# Patient Record
Sex: Female | Born: 1970 | Race: Black or African American | Hispanic: No | Marital: Single | Smoking: Never smoker
Health system: Southern US, Community
[De-identification: ages and names within clinical notes are randomized; demographics above are authoritative.]

## PROBLEM LIST (undated history)

## (undated) DIAGNOSIS — E079 Disorder of thyroid, unspecified: Secondary | ICD-10-CM

## (undated) DIAGNOSIS — E785 Hyperlipidemia, unspecified: Secondary | ICD-10-CM

## (undated) HISTORY — DX: Hyperlipidemia, unspecified: E78.5

---

## 1997-05-16 ENCOUNTER — Other Ambulatory Visit: Admission: RE | Admit: 1997-05-16 | Discharge: 1997-05-16 | Payer: Self-pay | Admitting: Internal Medicine

## 1998-11-22 ENCOUNTER — Ambulatory Visit (HOSPITAL_COMMUNITY): Admission: RE | Admit: 1998-11-22 | Discharge: 1998-11-22 | Payer: Self-pay | Admitting: Family Medicine

## 1998-11-22 ENCOUNTER — Encounter: Payer: Self-pay | Admitting: Family Medicine

## 1998-12-29 ENCOUNTER — Ambulatory Visit (HOSPITAL_COMMUNITY): Admission: RE | Admit: 1998-12-29 | Discharge: 1998-12-29 | Payer: Self-pay | Admitting: Family Medicine

## 1999-02-09 ENCOUNTER — Ambulatory Visit (HOSPITAL_COMMUNITY): Admission: RE | Admit: 1999-02-09 | Discharge: 1999-02-09 | Payer: Self-pay | Admitting: *Deleted

## 1999-03-26 ENCOUNTER — Ambulatory Visit (HOSPITAL_COMMUNITY): Admission: RE | Admit: 1999-03-26 | Discharge: 1999-03-26 | Payer: Self-pay | Admitting: *Deleted

## 1999-06-09 ENCOUNTER — Encounter: Admission: RE | Admit: 1999-06-09 | Discharge: 1999-09-07 | Payer: Self-pay | Admitting: Obstetrics

## 1999-06-10 ENCOUNTER — Encounter: Admission: RE | Admit: 1999-06-10 | Discharge: 1999-06-10 | Payer: Self-pay | Admitting: Obstetrics & Gynecology

## 1999-06-17 ENCOUNTER — Encounter: Admission: RE | Admit: 1999-06-17 | Discharge: 1999-06-17 | Payer: Self-pay | Admitting: Obstetrics & Gynecology

## 1999-06-24 ENCOUNTER — Encounter: Admission: RE | Admit: 1999-06-24 | Discharge: 1999-06-24 | Payer: Self-pay | Admitting: Obstetrics & Gynecology

## 1999-06-30 ENCOUNTER — Ambulatory Visit (HOSPITAL_COMMUNITY): Admission: RE | Admit: 1999-06-30 | Discharge: 1999-06-30 | Payer: Self-pay | Admitting: *Deleted

## 1999-07-01 ENCOUNTER — Encounter: Admission: RE | Admit: 1999-07-01 | Discharge: 1999-07-01 | Payer: Self-pay | Admitting: Obstetrics

## 1999-07-15 ENCOUNTER — Encounter: Admission: RE | Admit: 1999-07-15 | Discharge: 1999-07-15 | Payer: Self-pay | Admitting: Obstetrics

## 1999-07-22 ENCOUNTER — Encounter: Admission: RE | Admit: 1999-07-22 | Discharge: 1999-07-22 | Payer: Self-pay | Admitting: Obstetrics

## 1999-08-12 ENCOUNTER — Encounter: Admission: RE | Admit: 1999-08-12 | Discharge: 1999-08-12 | Payer: Self-pay | Admitting: Obstetrics

## 1999-08-21 ENCOUNTER — Inpatient Hospital Stay (HOSPITAL_COMMUNITY): Admission: AD | Admit: 1999-08-21 | Discharge: 1999-08-24 | Payer: Self-pay | Admitting: *Deleted

## 1999-08-21 ENCOUNTER — Encounter (INDEPENDENT_AMBULATORY_CARE_PROVIDER_SITE_OTHER): Payer: Self-pay

## 2001-12-26 ENCOUNTER — Encounter: Payer: Self-pay | Admitting: Family Medicine

## 2001-12-26 ENCOUNTER — Ambulatory Visit (HOSPITAL_COMMUNITY): Admission: RE | Admit: 2001-12-26 | Discharge: 2001-12-26 | Payer: Self-pay | Admitting: Family Medicine

## 2002-05-18 ENCOUNTER — Ambulatory Visit (HOSPITAL_COMMUNITY): Admission: RE | Admit: 2002-05-18 | Discharge: 2002-05-18 | Payer: Self-pay | Admitting: Family Medicine

## 2002-05-18 ENCOUNTER — Encounter: Payer: Self-pay | Admitting: Family Medicine

## 2002-08-07 ENCOUNTER — Emergency Department (HOSPITAL_COMMUNITY): Admission: EM | Admit: 2002-08-07 | Discharge: 2002-08-07 | Payer: Self-pay | Admitting: Emergency Medicine

## 2003-12-03 ENCOUNTER — Ambulatory Visit: Payer: Self-pay | Admitting: Family Medicine

## 2004-03-04 ENCOUNTER — Ambulatory Visit: Payer: Self-pay | Admitting: Family Medicine

## 2004-04-14 ENCOUNTER — Other Ambulatory Visit: Admission: RE | Admit: 2004-04-14 | Discharge: 2004-04-14 | Payer: Self-pay | Admitting: Family Medicine

## 2004-04-14 ENCOUNTER — Ambulatory Visit: Payer: Self-pay | Admitting: Family Medicine

## 2004-05-26 ENCOUNTER — Ambulatory Visit: Payer: Self-pay | Admitting: Family Medicine

## 2004-08-17 ENCOUNTER — Ambulatory Visit: Payer: Self-pay | Admitting: Family Medicine

## 2004-11-09 ENCOUNTER — Ambulatory Visit: Payer: Self-pay | Admitting: Family Medicine

## 2004-12-15 ENCOUNTER — Ambulatory Visit: Payer: Self-pay | Admitting: Family Medicine

## 2005-02-01 ENCOUNTER — Ambulatory Visit: Payer: Self-pay | Admitting: Family Medicine

## 2005-04-26 ENCOUNTER — Ambulatory Visit: Payer: Self-pay | Admitting: Family Medicine

## 2005-08-03 ENCOUNTER — Ambulatory Visit: Payer: Self-pay | Admitting: Internal Medicine

## 2005-08-04 ENCOUNTER — Ambulatory Visit: Payer: Self-pay | Admitting: Family Medicine

## 2005-10-31 ENCOUNTER — Encounter (INDEPENDENT_AMBULATORY_CARE_PROVIDER_SITE_OTHER): Payer: Self-pay | Admitting: Family Medicine

## 2005-10-31 LAB — CONVERTED CEMR LAB: Pap Smear: NORMAL

## 2005-11-11 ENCOUNTER — Encounter (INDEPENDENT_AMBULATORY_CARE_PROVIDER_SITE_OTHER): Payer: Self-pay | Admitting: Family Medicine

## 2005-11-11 ENCOUNTER — Ambulatory Visit: Payer: Self-pay | Admitting: Family Medicine

## 2005-11-11 ENCOUNTER — Other Ambulatory Visit: Admission: RE | Admit: 2005-11-11 | Discharge: 2005-11-11 | Payer: Self-pay | Admitting: Family Medicine

## 2006-03-14 ENCOUNTER — Ambulatory Visit: Payer: Self-pay | Admitting: Family Medicine

## 2006-07-06 ENCOUNTER — Encounter (INDEPENDENT_AMBULATORY_CARE_PROVIDER_SITE_OTHER): Payer: Self-pay | Admitting: Family Medicine

## 2006-07-06 DIAGNOSIS — Z8701 Personal history of pneumonia (recurrent): Secondary | ICD-10-CM | POA: Insufficient documentation

## 2006-07-06 DIAGNOSIS — A64 Unspecified sexually transmitted disease: Secondary | ICD-10-CM | POA: Insufficient documentation

## 2006-07-06 DIAGNOSIS — E039 Hypothyroidism, unspecified: Secondary | ICD-10-CM | POA: Insufficient documentation

## 2006-07-06 DIAGNOSIS — D509 Iron deficiency anemia, unspecified: Secondary | ICD-10-CM

## 2006-07-06 DIAGNOSIS — R809 Proteinuria, unspecified: Secondary | ICD-10-CM

## 2007-04-08 ENCOUNTER — Emergency Department (HOSPITAL_COMMUNITY): Admission: EM | Admit: 2007-04-08 | Discharge: 2007-04-08 | Payer: Self-pay | Admitting: Emergency Medicine

## 2007-07-07 ENCOUNTER — Ambulatory Visit: Payer: Self-pay | Admitting: Family Medicine

## 2007-08-21 ENCOUNTER — Ambulatory Visit: Payer: Self-pay | Admitting: Internal Medicine

## 2007-08-21 ENCOUNTER — Encounter (INDEPENDENT_AMBULATORY_CARE_PROVIDER_SITE_OTHER): Payer: Self-pay | Admitting: Family Medicine

## 2007-08-21 LAB — CONVERTED CEMR LAB
CO2: 21 meq/L (ref 19–32)
Cholesterol: 172 mg/dL (ref 0–200)
Free T4: 1.37 ng/dL (ref 0.89–1.80)
Glucose, Bld: 251 mg/dL — ABNORMAL HIGH (ref 70–99)
HDL: 48 mg/dL (ref >39)
Lymphocytes Relative: 32 % (ref 12–46)
Lymphs Abs: 1.6 10*3/uL (ref 0.7–4.0)
Neutrophils Relative %: 54 % (ref 43–77)
Platelets: 252 10*3/uL (ref 150–400)
Potassium: 4.2 meq/L (ref 3.5–5.3)
Sodium: 137 meq/L (ref 135–145)
TSH: 0.62 microintl units/mL (ref 0.350–4.50)
Total CHOL/HDL Ratio: 3.6
VLDL: 25 mg/dL (ref 0–40)
WBC: 5.1 10*3/uL (ref 4.0–10.5)

## 2007-09-07 ENCOUNTER — Ambulatory Visit: Payer: Self-pay | Admitting: Internal Medicine

## 2007-09-13 ENCOUNTER — Ambulatory Visit: Payer: Self-pay | Admitting: Family Medicine

## 2007-09-15 ENCOUNTER — Ambulatory Visit: Payer: Self-pay | Admitting: Internal Medicine

## 2007-09-25 ENCOUNTER — Ambulatory Visit: Payer: Self-pay | Admitting: Family Medicine

## 2007-11-03 ENCOUNTER — Ambulatory Visit: Payer: Self-pay | Admitting: Family Medicine

## 2008-01-09 ENCOUNTER — Encounter (INDEPENDENT_AMBULATORY_CARE_PROVIDER_SITE_OTHER): Payer: Self-pay | Admitting: Family Medicine

## 2008-01-09 ENCOUNTER — Ambulatory Visit: Payer: Self-pay | Admitting: Family Medicine

## 2008-01-09 ENCOUNTER — Other Ambulatory Visit: Admission: RE | Admit: 2008-01-09 | Discharge: 2008-01-09 | Payer: Self-pay | Admitting: Family Medicine

## 2008-04-30 ENCOUNTER — Ambulatory Visit: Payer: Self-pay | Admitting: Family Medicine

## 2008-04-30 LAB — CONVERTED CEMR LAB
AST: 14 units/L (ref 0–37)
BUN: 11 mg/dL (ref 6–23)
Calcium: 9 mg/dL (ref 8.4–10.5)
Chloride: 104 meq/L (ref 96–112)
Creatinine, Ser: 0.85 mg/dL (ref 0.40–1.20)
Free T4: 0.59 ng/dL — ABNORMAL LOW (ref 0.89–1.80)
Glucose, Bld: 133 mg/dL — ABNORMAL HIGH (ref 70–99)
TSH: 14.297 microintl units/mL — ABNORMAL HIGH (ref 0.350–4.500)

## 2008-07-16 ENCOUNTER — Ambulatory Visit: Payer: Self-pay | Admitting: Family Medicine

## 2008-07-16 LAB — CONVERTED CEMR LAB
Free T4: 0.55 ng/dL — ABNORMAL LOW (ref 0.80–1.80)
TSH: 13.096 microintl units/mL — ABNORMAL HIGH (ref 0.350–4.500)

## 2008-09-18 ENCOUNTER — Ambulatory Visit: Payer: Self-pay | Admitting: Family Medicine

## 2009-01-28 ENCOUNTER — Ambulatory Visit: Payer: Self-pay | Admitting: Family Medicine

## 2009-01-28 LAB — CONVERTED CEMR LAB: T4, Total: 4.7 ug/dL — ABNORMAL LOW (ref 5.0–12.5)

## 2009-07-04 ENCOUNTER — Ambulatory Visit: Payer: Self-pay | Admitting: Family Medicine

## 2009-11-04 ENCOUNTER — Encounter (INDEPENDENT_AMBULATORY_CARE_PROVIDER_SITE_OTHER): Payer: Self-pay | Admitting: Family Medicine

## 2009-11-04 LAB — CONVERTED CEMR LAB
Albumin: 4.7 g/dL (ref 3.5–5.2)
BUN: 12 mg/dL (ref 6–23)
CO2: 23 meq/L (ref 19–32)
Calcium: 9.5 mg/dL (ref 8.4–10.5)
Chloride: 104 meq/L (ref 96–112)
Cholesterol: 204 mg/dL — ABNORMAL HIGH (ref 0–200)
Creatinine, Ser: 0.9 mg/dL (ref 0.40–1.20)
Glucose, Bld: 167 mg/dL — ABNORMAL HIGH (ref 70–99)
HDL: 61 mg/dL (ref >39)
Potassium: 4.2 meq/L (ref 3.5–5.3)
Total CHOL/HDL Ratio: 3.3
Triglycerides: 87 mg/dL (ref <150)

## 2009-11-25 ENCOUNTER — Encounter (INDEPENDENT_AMBULATORY_CARE_PROVIDER_SITE_OTHER): Payer: Self-pay | Admitting: Family Medicine

## 2009-11-25 LAB — CONVERTED CEMR LAB
Free T4: 1.09 ng/dL (ref 0.80–1.80)
Hgb A1c MFr Bld: 8.8 % — ABNORMAL HIGH (ref <5.7)

## 2010-01-01 ENCOUNTER — Ambulatory Visit: Payer: Self-pay | Admitting: Obstetrics & Gynecology

## 2010-02-04 ENCOUNTER — Encounter: Payer: Self-pay | Admitting: Obstetrics & Gynecology

## 2010-02-04 ENCOUNTER — Ambulatory Visit
Admission: RE | Admit: 2010-02-04 | Discharge: 2010-02-04 | Payer: Self-pay | Source: Home / Self Care | Attending: Family Medicine | Admitting: Family Medicine

## 2010-02-05 ENCOUNTER — Encounter: Payer: Self-pay | Admitting: Obstetrics & Gynecology

## 2010-02-05 LAB — CONVERTED CEMR LAB: Yeast Wet Prep HPF POC: NONE SEEN

## 2010-02-18 ENCOUNTER — Ambulatory Visit
Admission: RE | Admit: 2010-02-18 | Discharge: 2010-02-18 | Payer: Self-pay | Source: Home / Self Care | Attending: Obstetrics and Gynecology | Admitting: Obstetrics and Gynecology

## 2010-02-18 LAB — POCT PREGNANCY, URINE: Preg Test, Ur: NEGATIVE

## 2010-02-19 NOTE — Progress Notes (Signed)
Ariana Cunningham, Ariana Cunningham              ACCOUNT NO.:  [id number]  MEDICAL RECORD NO.:  [id number]          PATIENT TYPE:  WOC  LOCATION:  [facility]                   FACILITY:  [facility]  PHYSICIAN:  Ariana Mellow, DO   DATE OF BIRTH:  1970-06-08  DATE OF SERVICE:                                 CLINIC NOTE  REASON FOR EXAMINATION:  IUD insertion.  HISTORY OF PRESENT ILLNESS:  The patient is a 40 year old gravida 2, para 2-0-0-2 African American female who presented to the [facility] on February 04, 2010 to establish gynecological care and to request contraception.  At that time she was seen by Dr. Macon Large and had a Pap test, gonorrhea and Chlamydia screening and was counseled about her birth control options and desired for an IUD.  Therefore, she presents to the clinic today for her IUD insertion.  She has no complaints today.  Past OB/GYN history has not changed.  The patient has a history of 1 vaginal delivery and 1 C-section, menarche at age 89, regular 30 days cycles with 3-4 days of flow.  Denies any history of abnormal Pap or any sexually transmitted disease.  PAST MEDICAL HISTORY:  Diabetes and hypothyroidism for which she is followed in [facility].  SURGICAL HISTORY:  One cesarean section.  MEDICATIONS:  Metformin and Synthroid.  ALLERGIES:  No known drug allergies.  PHYSICAL EXAMINATION:  VITAL SIGNS:  The patient has a temperature of 98.9, pulse of 69, blood pressure of 136/89. GENERAL:  She is in no apparent distress. GENITOURINARY:  Externally her external vagina is normal appearance. Her internal vagina is pink with appropriate amount of rugae, slight amount of clear discharge.  The patient was consented for IUD placement and time-out was performed at the time of procedure.  Uterus was sounded to 6.5 cm.  A Mirena IUD was placed without difficulty.  The strings were trimmed and the patient was allowed to feel the strings for other appropriate  coarseness.  ASSESSMENT:  Desired contraception with intrauterine device.  PLAN:  IUD was placed today without difficulty.  The patient was counseled on the risk and monitoring of the IUD and is asked to come back in about 3 months to have a string check or sooner if there is a problem.  Appropriate lab tests for today's exam include a negative pregnancy test today.  It was also reviewed the patient that she had a normal Pap test and negative gonorrhea and Chlamydia screening.  The patient has no questions and is discharged from the clinic in stable condition.          ______________________________ Ariana Mellow, DO    SH/MEDQ  D:  02/18/2010  T:  02/19/2010  Job:  [id number]

## 2010-05-07 ENCOUNTER — Ambulatory Visit: Payer: Self-pay | Admitting: Family

## 2010-05-20 ENCOUNTER — Ambulatory Visit (INDEPENDENT_AMBULATORY_CARE_PROVIDER_SITE_OTHER): Payer: Medicaid Other | Admitting: Obstetrics and Gynecology

## 2010-05-20 DIAGNOSIS — Z30431 Encounter for routine checking of intrauterine contraceptive device: Secondary | ICD-10-CM

## 2010-06-12 NOTE — Op Note (Signed)
[facility]  Patient:    Ariana Cunningham, Ariana Cunningham                       MRN: [id number] Proc. Date: 08/21/99 Adm. Date:  [id number] Attending:  Michaelle Copas                           Operative Report  PREOPERATIVE DIAGNOSES:       1. At [redacted] weeks gestation.                               2. Gestational diabetes.                               3. Persistent occiput posterior                                  position.                               4. Arrest of descent.  POSTOPERATIVE DIAGNOSES:      1. At [redacted] weeks gestation.                               2. Gestational diabetes.                               3. Persistent occiput posterior                                  position.                               4. Arrest of descent.  OPERATION:                    Primary low transverse cesarean section.  SURGEON:                      Charles A. Clearance Coots, M.D.  ASSISTANT:                    Lelon Perla, certified surgical technician.  ANESTHESIA:                   Epidural anesthesia.  ESTIMATED BLOOD LOSS:         800 mL.  IV FLUIDS:                    2400 mL.  URINE OUTPUT:                 250 mL of clear urine.  COMPLICATIONS:                None.  DRAINS:                       Foley catheter to gravity.  FINDINGS:  A viable female at 1615, Apgars of 8 at one minute and 9 at five minutes.  Weight of 3605 g.  Normal uterus, ovaries and fallopian tubes.  DESCRIPTION OF PROCEDURE:     The patient was brought to the operating room and after satisfactory redosing of the epidural, the abdomen was prepped and draped in the usual sterile fashion.  A Pfannenstiel skin incision was made with the scalpel that was deepened down to the fascia with the scalpel. The fascia was nicked in the midline and the fascial incision was extended to the left and the right with curved Mayo scissors.  The superior and inferior fascial edges were  taken off of the rectus muscles with sharp dissection.  The rectus muscle was bluntly sharply divided in the midline superiorly and inferiorly being careful to avoid the urinary bladder. The peritoneum was entered digitally and was extended to the left and to the right.  The bladder blade was positioned.  The vesicouterine fold and peritoneum above the reflection of the urinary bladder was grasped with forceps and was incised and undermined with Metzenbaum scissors.  The incision was extended to the left and to the right with the Metzenbaum scissors.  The bladder flap was bluntly developed and the bladder blade was repositioned in front of the urinary bladder, placing it well out of the operative field.  The uterus was entered transversely in the lower uterine segment with the scalpel.  The uterine incision was extended to the left and to the right with bandage scissors in a smile configuration.  The amniotic sac was then ruptured and a moderate amount of clear fluid was expelled.  The vertex was noted to be in the left occiput posterior position and was delivered with the aid of fundal pressure from the assistant.  The infants mouth and nose were suctioned with a suction bulb and the delivery was completed with the aid of fundal pressure from the assistant. The umbilical cord was doubly clamped with Kelly forceps and was cut with bandage scissors and the infant was handed off to the nursing staff.  Cord blood was obtained and the placenta was spontaneously expelled from the uterine cavity intact.  The endometrial surface was thoroughly debrided with a dry lap sponge and the uterus was exteriorized and the edges of the uterine incision were grasped with ring forceps. The uterus was then closed with a continuous interlocking suture of 0 Monocryl from each corner to the center.  Hemostasis was excellent.  The uterus was then placed back in its normal anatomic position and the pelvic cavity was  thoroughly irrigated with warm saline solution.  All clots were removed.  The abdomen was then closed as follows.  The fascia was closed with continuous suture of 0 PDS from each corner to the center.  Subcutaneous tissue was thoroughly irrigated with warm saline solution and all areas of subcutaneous bleeding were coagulated with the Bovie. Skin was approximated with surgical stainless steel staples.  A sterile bandage was applied to the incision closure.  Surgical technician indicated that all needles, sponge and instrument counts were correct.  The patient tolerated the procedure well and was transported to the recovery room in satisfactory condition. DD:  08/21/99 TD:  08/24/99 Job: [id number]

## 2010-06-12 NOTE — Discharge Summary (Signed)
[facility]  Patient:    Ariana Cunningham, Ariana Cunningham                       MRN: [id number] Adm. Date:  09/21/99 Attending:  Michaelle Copas Dictator:   Jamey Reas, M.D.                           Discharge Summary  DATE OF BIRTH:                1970/02/11  ADMISSION DIAGNOSES:          1. Term pregnancy.                               2. Onset of labor.                               3. Gestational diabetes.  DISCHARGE DIAGNOSES:          1. Term pregnancy.                               2. Status post low transverse cesarean section.                               3. Gestational diabetes. CONSULTS:                     None.  PROCEDURES:                   Patient had low transverse cesarean section on August 21, 1999 performed by Dr. Coral Ceo.  HOSPITAL COURSE:              Patient presented with spontaneous onset of labor.  Had failure to dilate and persistent OP position.  She had a primary low transverse cesarean section at which routine postoperative care patient was noted to be slightly anemic at time of discharge 7.0.  Baby Apgars were 8 at one minute and 9 at five minutes.  She is bottle feeding.  She received Depo-Provera 150 mg IM injection prior to discharge.  DISCHARGE CONDITION:          Stable.  MEDICATIONS:                  1. Motrin 600 mg one p.o. t.i.d. p.r.n. pain.                               2. Iron sulfate 325 mg one p.o. t.i.d.                               3. Prenatal vitamins one p.o. q.d.                               4. Percocet one to two p.o. q.4-6h. p.r.n. pain.  ACTIVITIES:                   Per instruction booklet.  DIET:  She will resume a regular diet at home.  FOLLOW-UP:                    She will follow up in six weeks at [facility] for routine check.  Her female infant will be discharged with her. DD:  10/27/99 TD:  10/27/99 Job: [id number]

## 2011-04-14 ENCOUNTER — Other Ambulatory Visit (HOSPITAL_COMMUNITY)
Admission: RE | Admit: 2011-04-14 | Discharge: 2011-04-14 | Disposition: A | Discharge status: Home / Self Care | Payer: Medicaid Other | Source: Ambulatory Visit | Attending: Advanced Practice Midwife | Admitting: Advanced Practice Midwife

## 2011-04-14 ENCOUNTER — Ambulatory Visit (INDEPENDENT_AMBULATORY_CARE_PROVIDER_SITE_OTHER): Payer: Medicaid Other | Admitting: Advanced Practice Midwife

## 2011-04-14 ENCOUNTER — Encounter: Payer: Self-pay | Admitting: Advanced Practice Midwife

## 2011-04-14 VITALS — BP 119/76 | HR 72 | Temp 97.8°F | Ht 64.0 in | Wt 170.3 lb

## 2011-04-14 DIAGNOSIS — Z01419 Encounter for gynecological examination (general) (routine) without abnormal findings: Secondary | ICD-10-CM | POA: Insufficient documentation

## 2011-04-14 DIAGNOSIS — Z0000 Encounter for general adult medical examination without abnormal findings: Secondary | ICD-10-CM

## 2011-04-14 NOTE — Progress Notes (Signed)
  Subjective:     Ariana Cunningham is a 41 y.o. female here for a routine exam.  Current complaints: none.  Personal health questionnaire reviewed: no.   Gynecologic History No LMP recorded. Patient is not currently having periods (Reason: IUD). Contraception: IUD Last Pap: one year . Results were: normal Last mammogram: n/a    Obstetric History OB History    Grav Para Term Preterm Abortions TAB SAB Ect Mult Living   2 2 2       2      # Outc Date GA Lbr Len/2nd Wgt Sex Del Anes PTL Lv   1 TRM            2 TRM                The following portions of the patient's history were reviewed and updated as appropriate: allergies, current medications, past family history, past medical history, past social history, past surgical history and problem list.  Review of Systems Pertinent items are noted in HPI.    Objective:    BP 119/76  Pulse 72  Temp(Src) 97.8 F (36.6 C) (Oral)  Ht 5\' 4"  (1.626 m)  Wt 170 lb 4.8 oz (77.248 kg)  BMI 29.23 kg/m2  General Appearance:    Alert, cooperative, no distress, appears stated age  Head:    Normocephalic, without obvious abnormality, atraumatic  Eyes:      Ears:      Nose:   Throat:     Neck:   Supple, symmetrical, trachea midline, no adenopathy;    thyroid:  no enlargement/tenderness/nodules;   Back:     Symmetric, no curvature, ROM normal, no CVA tenderness  Lungs:     Clear to auscultation bilaterally, respirations unlabored  Chest Wall:    No tenderness or deformity   Heart:    Regular rate and rhythm, S1 and S2 normal, no murmur, rub   or gallop  Breast Exam:    No tenderness, masses, or nipple abnormality  Abdomen:     Soft, non-tender, bowel sounds active all four quadrants,    no masses, no organomegaly  Genitalia:    Normal female without lesion, discharge or tenderness IUD string intact  Rectal:      Extremities:   Extremities normal, atraumatic, no cyanosis or edema  Pulses:   2+ and symmetric all extremities  Skin:    Skin color, texture, turgor normal, no rashes or lesions  Lymph nodes:     Neurologic:   Grossly normal      Assessment:    Healthy female exam.    Plan:    Follow up in: 1 year.

## 2011-04-14 NOTE — Patient Instructions (Signed)

## 2011-04-19 ENCOUNTER — Encounter: Payer: Self-pay | Admitting: *Deleted

## 2011-04-19 ENCOUNTER — Telehealth: Payer: Self-pay | Admitting: Obstetrics and Gynecology

## 2011-04-19 NOTE — Telephone Encounter (Signed)
Patient called and left message to call us back in regard of this Rx Called in in [facility] on [address] --Flagyl 2gm POX1 for Trich.

## 2011-04-19 NOTE — Telephone Encounter (Signed)
Message copied by Toula Moos on Mon Apr 19, 2011  9:02 AM ------      Message from: Aviva Signs      Created: Sat Apr 17, 2011 12:10 AM      Regarding: Trich on pap, also needs pap letter       RNs.    Trich seen on pap, needs Rx for Flagyl 2 gm po x1 (I will put order in but can you call?)            Admin.  Normal pap letter please

## 2011-04-19 NOTE — Telephone Encounter (Signed)
Telephoned pt at home # and advised pt of results and that med was at pharmacy and that partner would also need to be treated. Pt voiced understanding.

## 2012-03-21 ENCOUNTER — Emergency Department (HOSPITAL_COMMUNITY)
Admission: EM | Admit: 2012-03-21 | Discharge: 2012-03-21 | Disposition: A | Discharge status: Home / Self Care | Payer: Self-pay | Source: Home / Self Care

## 2012-03-21 ENCOUNTER — Encounter (HOSPITAL_COMMUNITY): Payer: Self-pay | Admitting: *Deleted

## 2012-03-21 DIAGNOSIS — E119 Type 2 diabetes mellitus without complications: Secondary | ICD-10-CM

## 2012-03-21 DIAGNOSIS — E039 Hypothyroidism, unspecified: Secondary | ICD-10-CM

## 2012-03-21 DIAGNOSIS — I10 Essential (primary) hypertension: Secondary | ICD-10-CM

## 2012-03-21 HISTORY — DX: Disorder of thyroid, unspecified: E07.9

## 2012-03-21 MED ORDER — LISINOPRIL 5 MG PO TABS: 5.0000 mg | ORAL_TABLET | Freq: Every day | ORAL | Status: DC

## 2012-03-21 MED ORDER — FREESTYLE SYSTEM KIT: 1.0000 | PACK | Status: DC | PRN

## 2012-03-21 MED ORDER — METFORMIN HCL 500 MG PO TABS: 500.0000 mg | ORAL_TABLET | Freq: Two times a day (BID) | ORAL | Status: DC

## 2012-03-21 MED ORDER — GLUCOSE BLOOD VI STRP: ORAL_STRIP | Status: DC

## 2012-03-21 MED ORDER — LEVOTHYROXINE SODIUM 175 MCG PO TABS: 175.0000 ug | ORAL_TABLET | Freq: Every day | ORAL | Status: DC

## 2012-03-21 NOTE — ED Provider Notes (Signed)
History     CSN: [id number]  Arrival date & time 03/21/12  1117   First MD Initiated Contact with Patient 03/21/12 1152      Chief Complaint  Patient presents with  . Follow-up    (Consider location/radiation/quality/duration/timing/severity/associated sxs/prior treatment) HPI  42 year old female with history of uncontrolled diabetes mellitus, hypothyroidism who [facility] comes for medication refill. Patient has been on metformin and Synthroid but has not been compliant with her medication. On reviewing labs that was done in June 2013 her hemoglobin A1c was 10 .6. Patient denies any headache, blurry vision, dry mouth, dizziness, chest pain, palpitations, shortness of breath, abdominal pain, nausea, vomiting, tingling or numbness of extremities. Patient denies any polyuria, polydipsia, bowel symptoms. Denies any change in her weight or appetite, hair loss, menstrual irregularities.  Past Medical History  Diagnosis Date  . Hyperlipidemia   . Diabetes mellitus   . Thyroid condition     Past Surgical History  Procedure Laterality Date  . Cesarean section      Family History  Problem Relation Age of Onset  . Family history unknown: Yes    History  Substance Use Topics  . Smoking status: Never Smoker   . Smokeless tobacco: Never Used  . Alcohol Use: No    OB History   Grav Para Term Preterm Abortions TAB SAB Ect Mult Living   2 2 2       2       Review of Systems  Allergies  Review of patient's allergies indicates no known allergies.  Home Medications   Current Outpatient Rx  Name  Route  Sig  Dispense  Refill  . Levothyroxine Sodium (SYNTHROID PO)   Oral   Take 1 tablet by mouth daily.         . metFORMIN (GLUCOPHAGE) 500 MG tablet   Oral   Take 500 mg by mouth daily.           BP 121/79  Pulse 67  Temp(Src) 98.3 F (36.8 C) (Oral)  Resp 16  SpO2 100%  LMP 03/05/2012  Physical Exam Related female in no acute  distress HEENT: No pallor, moist oral mucosa Chest: Clear to auscultation bilaterally no added sounds CVS: Normal S1 and S2, no murmurs rub or gallop Abdomen: Soft, nontender, nondistended, bowel sounds present Extremities: Warm, no edema CNS: AAO x3 ED Course  Procedures (including critical care time)  Labs Reviewed - No data to display No results found.   No diagnosis found.  Assessment/plan  Diabetes mellitus Has uncontrolled diabetes with last hemoglobin A1c all for greater than 10 checked 8 months back. Patient is on metformin and is inconsistent with her medications. She denies any associated of diabetes however is not compliant with her diet as well. I will recheck her hemoglobin A1c and a lipid panel. I will give her prescription for metformin 500 mg twice a day for now and have her followup in the clinic in one week to evaluate the blood work and adjust her medications. I will also prescribe her with glucometer and adjust medications. Her blood pressure is currently stable. Will give her a prescription of lisinopril that she has been taking as outpatient.  Hypothyroidism Patient is on Synthroid 175 MCG and again is taking it inconsistently. I will check a TSH level as well and we'll adjust dose as needed during her followup visit  MDM  Followup in 1 week for lab analysis and medication dose adjustment  Eddie North, MD 03/21/12 1247

## 2012-03-21 NOTE — ED Notes (Signed)
Pt here to establish care - diabetic - needs med refilled

## 2012-03-23 ENCOUNTER — Emergency Department (INDEPENDENT_AMBULATORY_CARE_PROVIDER_SITE_OTHER)
Admission: EM | Admit: 2012-03-23 | Discharge: 2012-03-23 | Disposition: A | Discharge status: Home / Self Care | Payer: Medicaid Other | Source: Home / Self Care

## 2012-03-23 DIAGNOSIS — I10 Essential (primary) hypertension: Secondary | ICD-10-CM

## 2012-03-23 DIAGNOSIS — E039 Hypothyroidism, unspecified: Secondary | ICD-10-CM

## 2012-03-23 DIAGNOSIS — E119 Type 2 diabetes mellitus without complications: Secondary | ICD-10-CM

## 2012-03-23 LAB — LIPID PANEL
HDL: 62 mg/dL (ref >39)
Total CHOL/HDL Ratio: 2.6 RATIO
VLDL: 17 mg/dL (ref 0–40)

## 2012-03-23 LAB — BASIC METABOLIC PANEL
BUN: 12 mg/dL (ref 6–23)
CO2: 27 mEq/L (ref 19–32)
Calcium: 9.1 mg/dL (ref 8.4–10.5)
Chloride: 99 mEq/L (ref 96–112)
Creatinine, Ser: 0.7 mg/dL (ref 0.50–1.10)
GFR calc Af Amer: >90 mL/min (ref >90)

## 2012-03-23 LAB — HEMOGLOBIN A1C
Hgb A1c MFr Bld: 8.6 % — ABNORMAL HIGH (ref <5.7)
Mean Plasma Glucose: 200 mg/dL — ABNORMAL HIGH (ref <117)

## 2012-03-23 LAB — TSH: TSH: 9.275 u[IU]/mL — ABNORMAL HIGH (ref 0.350–4.500)

## 2012-03-23 NOTE — ED Notes (Signed)
Patient here for bloodwork only TSH Lipid profile hgba1c bmet

## 2012-04-24 ENCOUNTER — Other Ambulatory Visit (HOSPITAL_COMMUNITY)
Admission: RE | Admit: 2012-04-24 | Discharge: 2012-04-24 | Disposition: A | Discharge status: Home / Self Care | Payer: Medicaid Other | Source: Ambulatory Visit | Attending: Obstetrics & Gynecology | Admitting: Obstetrics & Gynecology

## 2012-04-24 ENCOUNTER — Encounter: Payer: Self-pay | Admitting: Obstetrics & Gynecology

## 2012-04-24 ENCOUNTER — Ambulatory Visit (INDEPENDENT_AMBULATORY_CARE_PROVIDER_SITE_OTHER): Payer: Medicaid Other | Admitting: Obstetrics & Gynecology

## 2012-04-24 VITALS — BP 135/86 | HR 71 | Temp 98.2°F | Ht 64.0 in | Wt 142.3 lb

## 2012-04-24 DIAGNOSIS — R011 Cardiac murmur, unspecified: Secondary | ICD-10-CM

## 2012-04-24 DIAGNOSIS — Z01419 Encounter for gynecological examination (general) (routine) without abnormal findings: Secondary | ICD-10-CM | POA: Insufficient documentation

## 2012-04-24 DIAGNOSIS — Z30431 Encounter for routine checking of intrauterine contraceptive device: Secondary | ICD-10-CM

## 2012-04-24 DIAGNOSIS — Z113 Encounter for screening for infections with a predominantly sexual mode of transmission: Secondary | ICD-10-CM

## 2012-04-24 DIAGNOSIS — Z1151 Encounter for screening for human papillomavirus (HPV): Secondary | ICD-10-CM | POA: Insufficient documentation

## 2012-04-24 DIAGNOSIS — Z0000 Encounter for general adult medical examination without abnormal findings: Secondary | ICD-10-CM

## 2012-04-24 MED ORDER — LEVOTHYROXINE SODIUM 200 MCG PO TABS: 200.0000 ug | ORAL_TABLET | Freq: Every day | ORAL | Status: DC

## 2012-04-24 NOTE — Progress Notes (Signed)
Subjective:    Ariana Cunningham is a 42 y.o. female who presents for an annual exam. The patient has no complaints today. The patient is sexually active. GYN screening history: last pap: was normal. The patient wears seatbelts: yes. The patient participates in regular exercise: yes. Has the patient ever been transfused or tattooed?: no. The patient reports that there is not domestic violence in her life.   Menstrual History: OB History   Grav Para Term Preterm Abortions TAB SAB Ect Mult Living   2 2 2       2       Menarche age: 75 No LMP recorded. Patient is not currently having periods (Reason: IUD).    The following portions of the patient's history were reviewed and updated as appropriate: allergies, current medications, past family history, past medical history, past social history, past surgical history and problem list.  Review of Systems A comprehensive review of systems was negative.  Monogamous for 3 years, she lives with her daughters (48 and 74 and granddaughter 2yo). She works at [employer]. Her pap was normal last year (except Ivery Quale was seen).   Objective:    BP 135/86  Pulse 71  Temp(Src) 98.2 F (36.8 C) (Oral)  Ht 5\' 4"  (1.626 m)  Wt 142 lb 4.8 oz (64.547 kg)  BMI 24.41 kg/m2  General Appearance:    Alert, cooperative, no distress, appears stated age  Head:    Normocephalic, without obvious abnormality, atraumatic  Eyes:    PERRL, conjunctiva/corneas clear, EOM's intact, fundi    benign, both eyes  Ears:    Normal TM's and external ear canals, both ears  Nose:   Nares normal, septum midline, mucosa normal, no drainage    or sinus tenderness  Throat:   Lips, mucosa, and tongue normal; teeth and gums normal  Neck:   Supple, symmetrical, trachea midline, no adenopathy;    thyroid:  no enlargement/tenderness/nodules; no carotid   bruit or JVD  Back:     Symmetric, no curvature, ROM normal, no CVA tenderness  Lungs:     Clear to auscultation bilaterally,  respirations unlabored  Chest Wall:    No tenderness or deformity   Heart:   rrr with 5/6 holosystolic murmur  Breast Exam:    No tenderness, masses, or nipple abnormality  Abdomen:     Soft, non-tender, bowel sounds active all four quadrants,    no masses, no organomegaly  Genitalia:    Normal female without lesion, discharge or tenderness, NSSA, NT, mobile, normal adnexal exam     Extremities:   Extremities normal, atraumatic, no cyanosis or edema  Pulses:   2+ and symmetric all extremities  Skin:   Skin color, texture, turgor normal, no rashes or lesions  Lymph nodes:   Cervical, supraclavicular, and axillary nodes normal  Neurologic:   CNII-XII intact, normal strength, sensation and reflexes    throughout  .    Assessment:    Healthy female exam.  New onset murmur  Elevated TSh  Plan:     Thin prep Pap smear.  Increase synthroid to 200 mcg daily. She will f/u at [facility] 2 weeks Cards referral for new murmur

## 2012-05-19 ENCOUNTER — Emergency Department (HOSPITAL_COMMUNITY)
Admission: EM | Admit: 2012-05-19 | Discharge: 2012-05-19 | Disposition: A | Discharge status: Home / Self Care | Payer: Medicaid Other | Source: Home / Self Care

## 2012-05-19 ENCOUNTER — Encounter (HOSPITAL_COMMUNITY): Payer: Self-pay

## 2012-05-19 NOTE — ED Notes (Signed)
Follow up DM

## 2012-05-24 ENCOUNTER — Emergency Department (HOSPITAL_COMMUNITY)
Admission: EM | Admit: 2012-05-24 | Discharge: 2012-05-24 | Disposition: A | Discharge status: Home / Self Care | Payer: Medicaid Other | Source: Home / Self Care

## 2012-05-24 ENCOUNTER — Encounter (HOSPITAL_COMMUNITY): Payer: Self-pay

## 2012-05-24 DIAGNOSIS — E119 Type 2 diabetes mellitus without complications: Secondary | ICD-10-CM

## 2012-05-24 LAB — HEMOGLOBIN A1C: Hgb A1c MFr Bld: 7.8 % — ABNORMAL HIGH (ref <5.7)

## 2012-05-24 MED ORDER — LEVOTHYROXINE SODIUM 200 MCG PO TABS: 200.0000 ug | ORAL_TABLET | Freq: Every day | ORAL | Status: DC

## 2012-05-24 MED ORDER — LISINOPRIL 5 MG PO TABS: 5.0000 mg | ORAL_TABLET | Freq: Every day | ORAL | Status: DC

## 2012-05-24 MED ORDER — METFORMIN HCL 500 MG PO TABS: 1000.0000 mg | ORAL_TABLET | Freq: Two times a day (BID) | ORAL | Status: DC

## 2012-05-24 NOTE — ED Notes (Signed)
Patient here for medication refill and results of lab work

## 2012-08-23 ENCOUNTER — Encounter: Payer: Self-pay | Admitting: Family Medicine

## 2012-08-23 ENCOUNTER — Ambulatory Visit: Payer: Medicaid Other | Attending: Family Medicine | Admitting: Family Medicine

## 2012-08-23 VITALS — BP 153/90 | HR 74 | Temp 98.2°F | Resp 18 | Wt 136.8 lb

## 2012-08-23 DIAGNOSIS — I10 Essential (primary) hypertension: Secondary | ICD-10-CM | POA: Insufficient documentation

## 2012-08-23 DIAGNOSIS — E1310 Other specified diabetes mellitus with ketoacidosis without coma: Secondary | ICD-10-CM | POA: Insufficient documentation

## 2012-08-23 DIAGNOSIS — E039 Hypothyroidism, unspecified: Secondary | ICD-10-CM

## 2012-08-23 DIAGNOSIS — E1110 Type 2 diabetes mellitus with ketoacidosis without coma: Secondary | ICD-10-CM | POA: Insufficient documentation

## 2012-08-23 DIAGNOSIS — R809 Proteinuria, unspecified: Secondary | ICD-10-CM

## 2012-08-23 DIAGNOSIS — D509 Iron deficiency anemia, unspecified: Secondary | ICD-10-CM

## 2012-08-23 LAB — POCT GLYCOSYLATED HEMOGLOBIN (HGB A1C): Hemoglobin A1C: 7.6

## 2012-08-23 MED ORDER — HYDROCHLOROTHIAZIDE 12.5 MG PO TABS: 12.5000 mg | ORAL_TABLET | Freq: Every day | ORAL | Status: DC

## 2012-08-23 MED ORDER — METFORMIN HCL ER 500 MG PO TB24: 1000.0000 mg | ORAL_TABLET | Freq: Two times a day (BID) | ORAL | Status: DC

## 2012-08-23 MED ORDER — RAMIPRIL 2.5 MG PO CAPS: 2.5000 mg | ORAL_CAPSULE | Freq: Every day | ORAL | Status: DC

## 2012-08-23 NOTE — Progress Notes (Signed)
Patient here to establish care History of DM 

## 2012-08-23 NOTE — Patient Instructions (Addendum)
Cholesterol Cholesterol is a white, waxy, fat-like protein needed by your body in small amounts. The liver makes all the cholesterol you need. It is carried from the liver by the blood through the blood vessels. Deposits (plaque) may build up on blood vessel walls. This makes the arteries narrower and stiffer. Plaque increases the risk for heart attack and stroke. You cannot feel your cholesterol level even if it is very high. The only way to know is by a blood test to check your lipid (fats) levels. Once you know your cholesterol levels, you should keep a record of the test results. Work with your caregiver to to keep your levels in the desired range. WHAT THE RESULTS MEAN:  Total cholesterol is a rough measure of all the cholesterol in your blood.  LDL is the so-called bad cholesterol. This is the type that deposits cholesterol in the walls of the arteries. You want this level to be low.  HDL is the good cholesterol because it cleans the arteries and carries the LDL away. You want this level to be high.  Triglycerides are fat that the body can either burn for energy or store. High levels are closely linked to heart disease. DESIRED LEVELS:  Total cholesterol below 200.  LDL below 100 for people at risk, below 70 for very high risk.  HDL above 50 is good, above 60 is best.  Triglycerides below 150. HOW TO LOWER YOUR CHOLESTEROL:  Diet.  Choose fish or white meat chicken and [place], roasted or baked. Limit fatty cuts of red meat, fried foods, and processed meats, such as sausage and lunch meat.  Eat lots of fresh fruits and vegetables. Choose whole grains, beans, pasta, potatoes and cereals.  Use only small amounts of olive, corn or canola oils. Avoid butter, mayonnaise, shortening or palm kernel oils. Avoid foods with trans-fats.  Use skim/nonfat milk and low-fat/nonfat yogurt and cheeses. Avoid whole milk, cream, ice cream, egg yolks and cheeses. Healthy desserts include angel food  cake, ginger snaps, animal crackers, hard candy, popsicles, and low-fat/nonfat frozen yogurt. Avoid pastries, cakes, pies and cookies.  Exercise.  A regular program helps decrease LDL and raises HDL.  Helps with weight control.  Do things that increase your activity level like gardening, walking, or taking the stairs.  Medication.  May be prescribed by your caregiver to help lowering cholesterol and the risk for heart disease.  You may need medicine even if your levels are normal if you have several risk factors. HOME CARE INSTRUCTIONS   Follow your diet and exercise programs as suggested by your caregiver.  Take medications as directed.  Have blood work done when your caregiver feels it is necessary. MAKE SURE YOU:   Understand these instructions.  Will watch your condition.  Will get help right away if you are not doing well or get worse. Document Released: 10/06/2000 Document Revised: 04/05/2011 Document Reviewed: 03/29/2007 [facility] Patient Information 2014 [place], [place]. Blood Sugar Monitoring, Adult GLUCOSE METERS FOR SELF-MONITORING OF BLOOD GLUCOSE  It is important to be able to correctly measure your blood sugar (glucose). You can use a blood glucose monitor (a small battery-operated device) to check your glucose level at any time. This allows you and your caregiver to monitor your diabetes and to determine how well your treatment plan is working. The process of monitoring your blood glucose with a glucose meter is called self-monitoring of blood glucose (SMBG). When people with diabetes control their blood sugar, they have better health. To test  for glucose with a typical glucose meter, place the disposable strip in the meter. Then place a small sample of blood on the "test strip." The test strip is coated with chemicals that combine with glucose in blood. The meter measures how much glucose is present. The meter displays the glucose level as a number. Several new models  can record and store a number of test results. Some models can connect to personal computers to store test results or print them out.  Newer meters are often easier to use than older models. Some meters allow you to get blood from places other than your fingertip. Some new models have automatic timing, error codes, signals, or barcode readers to help with proper adjustment (calibration). Some meters have a large display screen or spoken instructions for people with visual impairments.  INSTRUCTIONS FOR USING GLUCOSE METERS  Wash your hands with soap and warm water, or clean the area with alcohol. Dry your hands completely.  Prick the side of your fingertip with a lancet (a sharp-pointed tool used by hand).  Hold the hand down and gently milk the finger until a small drop of blood appears. Catch the blood with the test strip.  Follow the instructions for inserting the test strip and using the SMBG meter. Most meters require the meter to be turned on and the test strip to be inserted before applying the blood sample.  Record the test result.  Read the instructions carefully for both the meter and the test strips that go with it. Meter instructions are found in the user manual. Keep this manual to help you solve any problems that may arise. Many meters use "error codes" when there is a problem with the meter, the test strip, or the blood sample on the strip. You will need the manual to understand these error codes and fix the problem.  New devices are available such as laser lancets and meters that can test blood taken from "alternative sites" of the body, other than fingertips. However, you should use standard fingertip testing if your glucose changes rapidly. Also, use standard testing if:  You have eaten, exercised, or taken insulin in the past 2 hours.  You think your glucose is low.  You tend to not feel symptoms of low blood glucose (hypoglycemia).  You are ill or under stress.  Clean  the meter as directed by the manufacturer.  Test the meter for accuracy as directed by the manufacturer.  Take your meter with you to your caregiver's office. This way, you can test your glucose in front of your caregiver to make sure you are using the meter correctly. Your caregiver can also take a sample of blood to test using a routine lab method. If values on the glucose meter are close to the lab results, you and your caregiver will see that your meter is working well and you are using good technique. Your caregiver will advise you about what to do if the results do not match. FREQUENCY OF TESTING  Your caregiver will tell you how often you should check your blood glucose. This will depend on your type of diabetes, your current level of diabetes control, and your types of medicines. The following are general guidelines, but your care plan may be different. Record all your readings and the time of day you took them for review with your caregiver.   Diabetes type 1.  When you are using insulin with good diabetic control (either multiple daily injections or via a  pump), you should check your glucose 4 times a day.  If your diabetes is not well controlled, you may need to monitor more frequently, including before meals and 2 hours after meals, at bedtime, and occasionally between 2 a.m. and 3 a.m.  You should always check your glucose before a dose of insulin or before changing the rate on your insulin pump.  Diabetes type 2.  Guidelines for SMBG in diabetes type 2 are not as well defined.  If you are on insulin, follow the guidelines above.  If you are on medicines, but not insulin, and your glucose is not well controlled, you should test at least twice daily.  If you are not on insulin, and your diabetes is controlled with medicines or diet alone, you should test at least once daily, usually before breakfast.  A weekly profile will help your caregiver advise you on your care plan. The  week before your visit, check your glucose before a meal and 2 hours after a meal at least daily. You may want to test before and after a different meal each day so you and your caregiver can tell how well controlled your blood sugars are throughout the course of a 24 hour period.  Gestational diabetes (diabetes during pregnancy).  Frequent testing is often necessary. Accurate timing is important.  If you are not on insulin, check your glucose 4 times a day. Check it before breakfast and 1 hour after the start of each meal.  If you are on insulin, check your glucose 6 times a day. Check it before each meal and 1 hour after the first bite of each meal.  General guidelines.  More frequent testing is required at the start of insulin treatment. Your caregiver will instruct you.  Test your glucose any time you suspect you have low blood sugar (hypoglycemia).  You should test more often when you change medicines, when you have unusual stress or illness, or in other unusual circumstances. OTHER THINGS TO KNOW ABOUT GLUCOSE METERS  Measurement Range. Most glucose meters are able to read glucose levels over a broad range of values from as low as 0 to as high as 600 mg/dL. If you get an extremely high or low reading from your meter, you should first confirm it with another reading. Report very high or very low readings to your caregiver.  Whole Blood Glucose versus Plasma Glucose. Some older home glucose meters measure glucose in your whole blood. In a lab or when using some newer home glucose meters, the glucose is measured in your plasma (one component of blood). The difference can be important. It is important for you and your caregiver to know whether your meter gives its results as "whole blood equivalent" or "plasma equivalent."  Display of High and Low Glucose Values. Part of learning how to operate a meter is understanding what the meter results mean. Know how high and low glucose concentrations  are displayed on your meter.  Factors that Affect Glucose Meter Performance. The accuracy of your test results depends on many factors and varies depending on the brand and type of meter. These factors include:  Low red blood cell count (anemia).  Substances in your blood (such as uric acid, vitamin C, and others).  Environmental factors (temperature, humidity, altitude).  Name-brand versus generic test strips.  Calibration. Make sure your meter is set up properly. It is a good idea to do a calibration test with a control solution recommended by the manufacturer of your  meter whenever you begin using a fresh bottle of test strips. This will help verify the accuracy of your meter.  Improperly stored, expired, or defective test strips. Keep your strips in a dry place with the lid on.  Soiled meter.  Inadequate blood sample. NEW TECHNOLOGIES FOR GLUCOSE TESTING Alternative site testing Some glucose meters allow testing blood from alternative sites. These include the:  Upper arm.  Forearm.  Base of the thumb.  Thigh. Sampling blood from alternative sites may be desirable. However, it may have some limitations. Blood in the fingertips show changes in glucose levels more quickly than blood in other parts of the body. This means that alternative site test results may be different from fingertip test results, not because of the meter's ability to test accurately, but because the actual glucose concentration can be different.  Continuous Glucose Monitoring Devices to measure your blood glucose continuously are available, and others are in development. These methods can be more expensive than self-monitoring with a glucose meter. However, it is uncertain how effective and reliable these devices are. Your caregiver will advise you if this approach makes sense for you. IF BLOOD SUGARS ARE CONTROLLED, PEOPLE WITH DIABETES REMAIN HEALTHIER.  SMBG is an important part of the treatment plan of  patients with diabetes mellitus. Below are reasons for using SMBG:   It confirms that your glucose is at a specific, healthy level.  It detects hypoglycemia and severe hyperglycemia.  It allows you and your caregiver to make adjustments in response to changes in lifestyle for individuals requiring medicine.  It determines the need for starting insulin therapy in temporary diabetes that happens during pregnancy (gestational diabetes). Document Released: 01/14/2003 Document Revised: 04/05/2011 Document Reviewed: 05/07/2010 [facility] Patient Information 2014 [place], [place].   Hypothyroidism The thyroid is a large gland located in the lower front of your neck. The thyroid gland helps control metabolism. Metabolism is how your body handles food. It controls metabolism with the hormone thyroxine. When this gland is underactive (hypothyroid), it produces too little hormone.  CAUSES These include:   Absence or destruction of thyroid tissue.  Goiter due to iodine deficiency.  Goiter due to medications.  Congenital defects (since birth).  Problems with the pituitary. This causes a lack of TSH (thyroid stimulating hormone). This hormone tells the thyroid to turn out more hormone. SYMPTOMS  Lethargy (feeling as though you have no energy)  Cold intolerance  Weight gain (in spite of normal food intake)  Dry skin  Coarse hair  Menstrual irregularity (if severe, may lead to infertility)  Slowing of thought processes Cardiac problems are also caused by insufficient amounts of thyroid hormone. Hypothyroidism in the newborn is cretinism, and is an extreme form. It is important that this form be treated adequately and immediately or it will lead rapidly to retarded physical and mental development. DIAGNOSIS  To prove hypothyroidism, your caregiver may do blood tests and ultrasound tests. Sometimes the signs are hidden. It may be necessary for your caregiver to watch this illness with blood  tests either before or after diagnosis and treatment. TREATMENT  Low levels of thyroid hormone are increased by using synthetic thyroid hormone. This is a safe, effective treatment. It usually takes about four weeks to gain the full effects of the medication. After you have the full effect of the medication, it will generally take another four weeks for problems to leave. Your caregiver may start you on low doses. If you have had heart problems the dose may be  gradually increased. It is generally not an emergency to get rapidly to normal. HOME CARE INSTRUCTIONS   Take your medications as your caregiver suggests. Let your caregiver know of any medications you are taking or start taking. Your caregiver will help you with dosage schedules.  As your condition improves, your dosage needs may increase. It will be necessary to have continuing blood tests as suggested by your caregiver.  Report all suspected medication side effects to your caregiver. SEEK MEDICAL CARE IF: Seek medical care if you develop:  Sweating.  Tremulousness (tremors).  Anxiety.  Rapid weight loss.  Heat intolerance.  Emotional swings.  Diarrhea.  Weakness. SEEK IMMEDIATE MEDICAL CARE IF:  You develop chest pain, an irregular heart beat (palpitations), or a rapid heart beat. MAKE SURE YOU:   Understand these instructions.  Will watch your condition.  Will get help right away if you are not doing well or get worse. Document Released: 01/11/2005 Document Revised: 04/05/2011 Document Reviewed: 09/01/2007 [facility] Patient Information 2014 [place], [place].

## 2012-08-23 NOTE — Progress Notes (Signed)
Patient ID: Ariana Cunningham, female   DOB: [birth year], 42 y.o.   MRN: [id number]  CC: follow up   HPI: Pt reports that she was not able to tolerate the lisinopril because of feeling tired and a little light headed when she was taking it and she was able to just stop taking it.   The patient says that she was only taking the metformin 1 tablet twice daily with meals.  She didn't understand that she was supposed to be taking 2 tabs with breakfast and 2 tabs with supper.    No Known Allergies Past Medical History  Diagnosis Date  . Hyperlipidemia   . Diabetes mellitus   . Thyroid condition    Current Outpatient Prescriptions on File Prior to Visit  Medication Sig Dispense Refill  . glucose blood (RELION GLUCOSE TEST STRIPS) test strip Use as instructed  100 each  12  . glucose monitoring kit (FREESTYLE) monitoring kit 1 each by Does not apply route as needed for other (Relion).  1 each  0  . levothyroxine (SYNTHROID, LEVOTHROID) 200 MCG tablet Take 1 tablet (200 mcg total) by mouth daily.  30 tablet  5  . lisinopril (PRINIVIL,ZESTRIL) 5 MG tablet Take 1 tablet (5 mg total) by mouth daily.  30 tablet  6   No current facility-administered medications on file prior to visit.   No family history on file. History   Social History  . Marital Status: Single    Spouse Name: N/A    Number of Children: N/A  . Years of Education: N/A   Occupational History  . Not on file.   Social History Main Topics  . Smoking status: Never Smoker   . Smokeless tobacco: Never Used  . Alcohol Use: No  . Drug Use: No  . Sexually Active: Yes -- Female partner(s)    Birth Control/ Protection: IUD   Other Topics Concern  . Not on file   Social History Narrative  . No narrative on file    Review of Systems  Constitutional: Negative for fever, chills, diaphoresis, activity change, appetite change and fatigue.  HENT: Negative for ear pain, nosebleeds, congestion, facial swelling, rhinorrhea, neck pain,  neck stiffness and ear discharge.   Eyes: Negative for pain, discharge, redness, itching and visual disturbance.  Respiratory: Negative for cough, choking, chest tightness, shortness of breath, wheezing and stridor.   Cardiovascular: Negative for chest pain, palpitations and leg swelling.  Gastrointestinal: Negative for abdominal distention.  Genitourinary: Negative for dysuria, urgency, frequency, hematuria, flank pain, decreased urine volume, difficulty urinating and dyspareunia.  Musculoskeletal: Negative for back pain, joint swelling, arthralgias and gait problem.  Neurological: Negative for dizziness, tremors, seizures, syncope, facial asymmetry, speech difficulty, weakness, light-headedness, numbness and headaches.  Hematological: Negative for adenopathy. Does not bruise/bleed easily.  Psychiatric/Behavioral: Negative for hallucinations, behavioral problems, confusion, dysphoric mood, decreased concentration and agitation.    Objective:   Filed Vitals:   08/23/12 0906  BP: 153/90  Pulse: 74  Temp: 98.2 F (36.8 C)  Resp: 18    Physical Exam  Constitutional: Appears well-developed and well-nourished. No distress.  HENT: Normocephalic. External right and left ear normal. Oropharynx is clear and moist.  Eyes: Conjunctivae and EOM are normal. PERRLA, no scleral icterus.  Neck: Normal ROM. Neck supple. No JVD. No tracheal deviation. No thyromegaly.  CVS: RRR, S1/S2 +, no murmurs, no gallops, no carotid bruit.  Pulmonary: Effort and breath sounds normal, no stridor, rhonchi, wheezes, rales.  Abdominal: Soft. BS +,  no distension, tenderness, rebound or guarding.  Musculoskeletal: Normal range of motion. No edema and no tenderness.  Lymphadenopathy: No lymphadenopathy noted, cervical, inguinal. Neuro: Alert. Normal reflexes, muscle tone coordination. No cranial nerve deficit. Skin: Skin is warm and dry. No rash noted. Not diaphoretic. No erythema. No pallor.  Psychiatric: Normal  mood and affect. Behavior, judgment, thought content normal.   Lab Results  Component Value Date   WBC 5.1 08/21/2007   HGB 11.4* 08/21/2007   HCT 35.2* 08/21/2007   MCV 100.0 08/21/2007   PLT 252 08/21/2007   Lab Results  Component Value Date   CREATININE 0.70 03/23/2012   BUN 12 03/23/2012   NA 135 03/23/2012   K 4.5 03/23/2012   CL 99 03/23/2012   CO2 27 03/23/2012    Lab Results  Component Value Date   HGBA1C 7.6% 08/23/2012   Lipid Panel     Component Value Date/Time   CHOL 160 03/23/2012 1045   TRIG 87 03/23/2012 1045   HDL 62 03/23/2012 1045   CHOLHDL 2.6 03/23/2012 1045   VLDL 17 03/23/2012 1045   LDLCALC 81 03/23/2012 1045     Assessment and plan:   Patient Active Problem List   Diagnosis Date Noted  . SEXUALLY TRANSMITTED DISEASE 07/06/2006  . HYPOTHYROIDISM 07/06/2006  . ANEMIA, IRON DEFICIENCY NOS 07/06/2006  . PROTEINURIA 07/06/2006  . HX, PNEUMONIA 07/06/2006  DM (diabetes mellitus) type 2, uncontrolled, with ketoacidosis - Plan: Glucose (CBG), HgB A1c, CBC, COMPLETE METABOLIC PANEL WITH GFR, Lipid panel, TSH, Vitamin D 25 hydroxy, Microalbumin / creatinine urine ratio  Unspecified essential hypertension - Plan: CBC, COMPLETE METABOLIC PANEL WITH GFR, Lipid panel, TSH, Vitamin D 25 hydroxy, Microalbumin / creatinine urine ratio  Trial of ramipril 2.5 mg po daily HCTZ 12.5 mg po daily Switched patient to the extended release metformin tablets for better GI tolerance  BP check in 2 weeks  The patient was given clear instructions to go to ER or return to medical center if symptoms don't improve, worsen or new problems develop.  The patient verbalized understanding.  The patient was told to call to get any lab results if not heard anything in the next week.    RTC in 3 months for follow up   Rodney Langton, MD, CDE, FAAFP [facility] [place], [place]

## 2012-08-24 LAB — COMPLETE METABOLIC PANEL WITH GFR
ALT: 10 U/L (ref 0–35)
CO2: 27 mEq/L (ref 19–32)
Calcium: 9.4 mg/dL (ref 8.4–10.5)
Chloride: 103 mEq/L (ref 96–112)
GFR, Est African American: >89 mL/min
Sodium: 136 mEq/L (ref 135–145)
Total Protein: 7.3 g/dL (ref 6.0–8.3)

## 2012-08-24 LAB — LIPID PANEL: LDL Cholesterol: 97 mg/dL (ref 0–99)

## 2012-08-24 LAB — CBC
Platelets: 286 10*3/uL (ref 150–400)
RBC: 3.54 MIL/uL — ABNORMAL LOW (ref 3.87–5.11)
WBC: 6.2 10*3/uL (ref 4.0–10.5)

## 2012-08-24 LAB — TSH: TSH: 3.406 u[IU]/mL (ref 0.350–4.500)

## 2012-08-30 LAB — MICROALBUMIN / CREATININE URINE RATIO

## 2012-08-31 ENCOUNTER — Telehealth: Payer: Self-pay

## 2012-08-31 NOTE — Progress Notes (Signed)
Quick Note:  Please inform patient that labs show that pt has mild anemia. Also vitamin D level came back very low. Please call in prescription for Vitamin D. Drisdol 50,000 IU caps, take 1 po once per week, #8, No RF. Thyroid levels came back OK. Please find out what happened to the microalbumin test that was ordered. The lab cancelled the order and reported that specimen was inadequate.   Rodney Langton, MD, CDE, FAAFP [facility] [place], [place]   ______

## 2012-08-31 NOTE — Telephone Encounter (Signed)
Not available Left message on machine to please return our call

## 2012-08-31 NOTE — Telephone Encounter (Signed)
Message copied by Lestine Mount on Thu Aug 31, 2012  9:01 AM ------      Message from: Cleora Fleet      Created: Thu Aug 31, 2012  7:16 AM       Please inform patient that labs show that pt has mild anemia.  Also vitamin D level came back very low. Please call in prescription for Vitamin D.  Drisdol 50,000 IU caps, take 1 po once per week, #8, No RF.  Thyroid levels came back OK.  Please find out what happened to the microalbumin test that was ordered.  The lab cancelled the order and reported that specimen was inadequate.              Rodney Langton, MD, CDE, FAAFP      [facility]      [facility]      [place], [place]        ------

## 2012-11-23 ENCOUNTER — Ambulatory Visit: Payer: Medicaid Other | Attending: Internal Medicine | Admitting: Internal Medicine

## 2012-11-23 VITALS — BP 144/83 | HR 76 | Temp 98.6°F | Resp 16 | Wt 140.0 lb

## 2012-11-23 DIAGNOSIS — E119 Type 2 diabetes mellitus without complications: Secondary | ICD-10-CM

## 2012-11-23 DIAGNOSIS — Z23 Encounter for immunization: Secondary | ICD-10-CM

## 2012-11-23 LAB — POCT CBG (FASTING - GLUCOSE)-MANUAL ENTRY: Glucose Fasting, POC: 140 mg/dL — AB (ref 70–99)

## 2012-11-23 MED ORDER — LISINOPRIL 10 MG PO TABS: 10.0000 mg | ORAL_TABLET | Freq: Every day | ORAL | Status: DC

## 2012-11-23 MED ORDER — GLUCOSE BLOOD VI STRP: ORAL_STRIP | Status: DC

## 2012-11-23 MED ORDER — FREESTYLE SYSTEM KIT: 1.0000 | PACK | Status: DC | PRN

## 2012-11-23 MED ORDER — ACCU-CHEK MULTICLIX LANCET DEV KIT: 1.0000 | PACK | Freq: Three times a day (TID) | Status: AC

## 2012-11-23 MED ORDER — GLIPIZIDE 2.5 MG HALF TABLET: 2.5000 mg | ORAL_TABLET | Freq: Two times a day (BID) | ORAL | Status: DC

## 2012-11-23 MED ORDER — ACCU-CHEK MULTICLIX LANCETS MISC: Status: DC

## 2012-11-23 MED ORDER — ACCU-CHEK AVIVA PLUS W/DEVICE KIT: 1.0000 | PACK | Freq: Three times a day (TID) | Status: DC

## 2012-11-23 NOTE — Patient Instructions (Addendum)
Accuchecks 4 times/day, Once in AM empty stomach and then before each meal. Log in all results and show them to your Prim.MD next visit If any glucose reading is under 80 or above 300 call your Prim MD immidiately. Follow Low glucose instructions for glucose under 80 as instructed.   Hypoglycemia (Low Blood Sugar) Hypoglycemia is when the glucose (sugar) in your blood is too low. Hypoglycemia can happen for many reasons. It can happen to people with or without diabetes. Hypoglycemia can develop quickly and can be a medical emergency.  CAUSES  Having hypoglycemia does not mean that you will develop diabetes. Different causes include:  Missed or delayed meals or not enough carbohydrates eaten.  Medication overdose. This could be by accident or deliberate. If by accident, your medication may need to be adjusted or changed.  Exercise or increased activity without adjustments in carbohydrates or medications.  A nerve disorder that affects body functions like your heart rate, blood pressure and digestion (autonomic neuropathy).  A condition where the stomach muscles do not function properly (gastroparesis). Therefore, medications may not absorb properly.  The inability to recognize the signs of hypoglycemia (hypoglycemic unawareness).  Absorption of insulin  may be altered.  Alcohol consumption.  Pregnancy/menstrual cycles/postpartum. This may be due to hormones.  Certain kinds of tumors. This is very rare. SYMPTOMS   Sweating.  Hunger.  Dizziness.  Blurred vision.  Drowsiness.  Weakness.  Headache.  Rapid heart beat.  Shakiness.  Nervousness. DIAGNOSIS  Diagnosis is made by monitoring blood glucose in one or all of the following ways:  Fingerstick blood glucose monitoring.  Laboratory results. TREATMENT  If you think your blood glucose is low:  Check your blood glucose, if possible. If it is less than 70 mg/dl, take one of the following:  3-4 glucose  tablets.   cup juice (prefer clear like apple).   cup "regular" soda pop.  1 cup milk.  -1 tube of glucose gel.  5-6 hard candies.  Do not over treat because your blood glucose (sugar) will only go too high.  Wait 15 minutes and recheck your blood glucose. If it is still less than 70 mg/dl (or below your target range), repeat treatment.  Eat a snack if it is more than one hour until your next meal. Sometimes, your blood glucose may go so low that you are unable to treat yourself. You may need someone to help you. You may even pass out or be unable to swallow. This may require you to get an injection of glucagon, which raises the blood glucose. HOME CARE INSTRUCTIONS  Check blood glucose as recommended by your caregiver.  Take medication as prescribed by your caregiver.  Follow your meal plan. Do not skip meals. Eat on time.  If you are going to drink alcohol, drink it only with meals.  Check your blood glucose before driving.  Check your blood glucose before and after exercise. If you exercise longer or different than usual, be sure to check blood glucose more frequently.  Always carry treatment with you. Glucose tablets are the easiest to carry.  Always wear medical alert jewelry or carry some form of identification that states that you have diabetes. This will alert people that you have diabetes. If you have hypoglycemia, they will have a better idea on what to do. SEEK MEDICAL CARE IF:   You are having problems keeping your blood sugar at target range.  You are having frequent episodes of hypoglycemia.  You feel you  might be having side effects from your medicines.  You have symptoms of an illness that is not improving after 3-4 days.  You notice a change in vision or a new problem with your vision. SEEK IMMEDIATE MEDICAL CARE IF:   You are a family member or friend of a person whose blood glucose goes below 70 mg/dl and is accompanied by:  Confusion.  A  change in mental status.  The inability to swallow.  Passing out. Document Released: 01/11/2005 Document Revised: 04/05/2011 Document Reviewed: 05/10/2011 [facility] Patient Information 2014 [place], [place].

## 2012-11-23 NOTE — Progress Notes (Unsigned)
Patient ID: Ariana Cunningham, female   DOB: 1970-10-16, 42 y.o.   MRN: [id number]  Patient Demographics  Ariana Cunningham, is a 42 y.o. female  [id number]  [id number]  DOB - [birth year]  Chief Complaint  Patient presents with  . Follow-up        Subjective:   Ariana Cunningham with History of diabetes mellitus type 2, hypertension, hypothyroidism is here for routine followup visit, no subjective complaints whatsoever.  Denies any subjective complaints except as above, no active headache, no chest abdominal pain at this time, not short of breath. No focal weakness which is new.   Objective:    Patient Active Problem List   Diagnosis Date Noted  . DM (diabetes mellitus) type 2, uncontrolled, with ketoacidosis 08/23/2012  . Unspecified essential hypertension 08/23/2012  . HYPOTHYROIDISM 07/06/2006  . ANEMIA, IRON DEFICIENCY NOS 07/06/2006  . PROTEINURIA 07/06/2006  . HX, PNEUMONIA 07/06/2006     Filed Vitals:   11/23/12 0906  BP: 144/83  Pulse: 76  Temp: 98.6 F (37 C)  Resp: 16  Weight: 140 lb (63.504 kg)  SpO2: 100%     Exam   Awake Alert, Oriented X 3, No new F.N deficits, Normal affect [place].AT,PERRAL Supple Neck,No JVD, No cervical lymphadenopathy appriciated.  Symmetrical Chest wall movement, Good air movement bilaterally, CTAB RRR,No Gallops,Rubs or new Murmurs, No Parasternal Heave +ve B.Sounds, Abd Soft, Non tender, No organomegaly appriciated, No rebound - guarding or rigidity. No Cyanosis, Clubbing or edema, No new Rash or bruise       Data Review   Lab Results  Component Value Date   WBC 6.2 08/23/2012   HGB 11.5* 08/23/2012   HCT 35.2* 08/23/2012   MCV 99.4 08/23/2012   PLT 286 08/23/2012      Chemistry      Component Value Date/Time   NA 136 08/23/2012 1006   K 4.4 08/23/2012 1006   CL 103 08/23/2012 1006   CO2 27 08/23/2012 1006   BUN 12 08/23/2012 1006   CREATININE 0.75 08/23/2012 1006   CREATININE 0.70 03/23/2012 1045      Component  Value Date/Time   CALCIUM 9.4 08/23/2012 1006   ALKPHOS 50 08/23/2012 1006   AST 13 08/23/2012 1006   ALT 10 08/23/2012 1006   BILITOT 0.4 08/23/2012 1006       Lab Results  Component Value Date   HGBA1C 7.1 11/23/2012    Lab Results  Component Value Date   CHOL 173 08/23/2012   HDL 60 08/23/2012   LDLCALC 97 08/23/2012   TRIG 80 08/23/2012   CHOLHDL 2.9 08/23/2012    Lab Results  Component Value Date   TSH 3.406 08/23/2012     Prior to Admission medications   Medication Sig Start Date End Date Taking? Authorizing Provider  glipiZIDE (GLUCOTROL) 2.5 mg TABS tablet Take 0.5 tablets (2.5 mg total) by mouth 2 (two) times daily before a meal. 11/23/12   Leroy Sea, MD  glucose blood (RELION GLUCOSE TEST STRIPS) test strip Use as instructed 11/23/12   Leroy Sea, MD  glucose monitoring kit (FREESTYLE) monitoring kit 1 each by Does not apply route as needed for other (Any glucometer with q. a.c. at bedtime testing supplies). 11/23/12   Leroy Sea, MD  hydrochlorothiazide (HYDRODIURIL) 12.5 MG tablet Take 1 tablet (12.5 mg total) by mouth daily. 08/23/12   Clanford Cyndie Mull, MD  levothyroxine (SYNTHROID, LEVOTHROID) 200 MCG tablet Take 1 tablet (200 mcg total) by mouth daily. 05/24/12  Edsel Petrin, DO  lisinopril (PRINIVIL,ZESTRIL) 10 MG tablet Take 1 tablet (10 mg total) by mouth daily. 11/23/12   Leroy Sea, MD  metFORMIN (GLUCOPHAGE XR) 500 MG 24 hr tablet Take 2 tablets (1,000 mg total) by mouth 2 (two) times daily with a meal. 08/23/12   Clanford Cyndie Mull, MD     Assessment & Plan    Allergies mellitus type II. In poor control A1c is consistently above 7, early morning fasting CBG in the clinic is 175, she is on Glucophage XR 1000 mg twice a day, she will be placed on additional Glucotrol 2.5 mg twice a day. Ophthalmology referral has been made. Today A1c is 7.1. She will come back in 3 months.  At instructions on q. a.c. at bedtime Accu-Cheks along  with testing supplies, she has been educated about  hypoglycemia symptoms.  Hypertension. In poor control when accounting for type 2 diabetes mellitus, lisinopril dose increased from 5-10 mg.     She will need repeat BMP, TSH and A1c next visit.     Routine health maintenance.  Flu shot given  OB referral made for mammogram and Pap smear   Leroy Sea M.D on 11/23/2012 at 9:26 AM

## 2012-11-23 NOTE — Progress Notes (Unsigned)
Patient here for follow up HTN and DM

## 2012-12-12 ENCOUNTER — Encounter: Payer: Self-pay | Admitting: Advanced Practice Midwife

## 2013-01-22 ENCOUNTER — Encounter: Payer: Self-pay | Admitting: Advanced Practice Midwife

## 2013-01-22 ENCOUNTER — Ambulatory Visit (INDEPENDENT_AMBULATORY_CARE_PROVIDER_SITE_OTHER): Payer: Medicaid Other | Admitting: Advanced Practice Midwife

## 2013-01-22 VITALS — BP 120/79 | HR 72 | Temp 98.3°F | Ht 64.0 in | Wt 145.4 lb

## 2013-01-22 DIAGNOSIS — Z1231 Encounter for screening mammogram for malignant neoplasm of breast: Secondary | ICD-10-CM

## 2013-01-22 DIAGNOSIS — R87615 Unsatisfactory cytologic smear of cervix: Secondary | ICD-10-CM

## 2013-01-22 DIAGNOSIS — Z124 Encounter for screening for malignant neoplasm of cervix: Secondary | ICD-10-CM

## 2013-01-22 NOTE — Progress Notes (Signed)
Sent here for pap due to absent TZ.  All paps normal HR HPV was NEGATIVE  Did get mammogram scheduled   Per guidelines repeat pap not necessary

## 2013-02-01 ENCOUNTER — Ambulatory Visit (HOSPITAL_COMMUNITY)
Admission: RE | Admit: 2013-02-01 | Discharge: 2013-02-01 | Disposition: A | Discharge status: Home / Self Care | Payer: Medicaid Other | Source: Ambulatory Visit | Attending: Advanced Practice Midwife | Admitting: Advanced Practice Midwife

## 2013-02-01 DIAGNOSIS — Z1231 Encounter for screening mammogram for malignant neoplasm of breast: Secondary | ICD-10-CM | POA: Insufficient documentation

## 2013-04-11 ENCOUNTER — Encounter: Payer: Self-pay | Admitting: Interventional Cardiology

## 2013-05-02 ENCOUNTER — Encounter: Payer: Self-pay | Admitting: Interventional Cardiology

## 2013-05-04 ENCOUNTER — Encounter: Payer: Self-pay | Admitting: Internal Medicine

## 2013-05-04 ENCOUNTER — Ambulatory Visit: Payer: Medicaid Other | Attending: Internal Medicine | Admitting: Internal Medicine

## 2013-05-04 VITALS — BP 128/84 | HR 84 | Temp 98.8°F | Resp 16 | Ht 64.0 in | Wt 150.0 lb

## 2013-05-04 DIAGNOSIS — Z139 Encounter for screening, unspecified: Secondary | ICD-10-CM | POA: Diagnosis not present

## 2013-05-04 DIAGNOSIS — I10 Essential (primary) hypertension: Secondary | ICD-10-CM | POA: Diagnosis not present

## 2013-05-04 DIAGNOSIS — E039 Hypothyroidism, unspecified: Secondary | ICD-10-CM | POA: Diagnosis not present

## 2013-05-04 DIAGNOSIS — E119 Type 2 diabetes mellitus without complications: Secondary | ICD-10-CM | POA: Insufficient documentation

## 2013-05-04 LAB — COMPLETE METABOLIC PANEL WITH GFR
ALBUMIN: 4.1 g/dL (ref 3.5–5.2)
ALK PHOS: 47 U/L (ref 39–117)
ALT: 16 U/L (ref 0–35)
AST: 15 U/L (ref 0–37)
BILIRUBIN TOTAL: 0.4 mg/dL (ref 0.2–1.2)
BUN: 12 mg/dL (ref 6–23)
CO2: 26 mEq/L (ref 19–32)
Calcium: 9 mg/dL (ref 8.4–10.5)
Chloride: 101 mEq/L (ref 96–112)
Creat: 0.88 mg/dL (ref 0.50–1.10)
GFR, Est African American: >89 mL/min
GFR, Est Non African American: 82 mL/min
Glucose, Bld: 131 mg/dL — ABNORMAL HIGH (ref 70–99)
POTASSIUM: 3.9 meq/L (ref 3.5–5.3)
SODIUM: 137 meq/L (ref 135–145)
Total Protein: 6.8 g/dL (ref 6.0–8.3)

## 2013-05-04 LAB — CBC WITH DIFFERENTIAL/PLATELET
BASOS PCT: 1 % (ref 0–1)
Basophils Absolute: 0.1 10*3/uL (ref 0.0–0.1)
EOS ABS: 0.1 10*3/uL (ref 0.0–0.7)
Eosinophils Relative: 2 % (ref 0–5)
HCT: 32.5 % — ABNORMAL LOW (ref 36.0–46.0)
Hemoglobin: 11.2 g/dL — ABNORMAL LOW (ref 12.0–15.0)
Lymphocytes Relative: 49 % — ABNORMAL HIGH (ref 12–46)
Lymphs Abs: 2.8 10*3/uL (ref 0.7–4.0)
MCH: 33.4 pg (ref 26.0–34.0)
MCHC: 34.5 g/dL (ref 30.0–36.0)
MCV: 97 fL (ref 78.0–100.0)
MONO ABS: 0.3 10*3/uL (ref 0.1–1.0)
Monocytes Relative: 5 % (ref 3–12)
NEUTROS ABS: 2.5 10*3/uL (ref 1.7–7.7)
Neutrophils Relative %: 43 % (ref 43–77)
Platelets: 259 10*3/uL (ref 150–400)
RBC: 3.35 MIL/uL — ABNORMAL LOW (ref 3.87–5.11)
RDW: 13.4 % (ref 11.5–15.5)
WBC: 5.8 10*3/uL (ref 4.0–10.5)

## 2013-05-04 LAB — POCT GLYCOSYLATED HEMOGLOBIN (HGB A1C): HEMOGLOBIN A1C: 8.6

## 2013-05-04 LAB — LIPID PANEL
CHOL/HDL RATIO: 3.2 ratio
CHOLESTEROL: 185 mg/dL (ref 0–200)
HDL: 58 mg/dL (ref >39)
LDL Cholesterol: 108 mg/dL — ABNORMAL HIGH (ref 0–99)
TRIGLYCERIDES: 97 mg/dL (ref <150)
VLDL: 19 mg/dL (ref 0–40)

## 2013-05-04 LAB — GLUCOSE, POCT (MANUAL RESULT ENTRY): POC GLUCOSE: 152 mg/dL — AB (ref 70–99)

## 2013-05-04 MED ORDER — METFORMIN HCL ER 500 MG PO TB24: 1000.0000 mg | ORAL_TABLET | Freq: Two times a day (BID) | ORAL | Status: DC

## 2013-05-04 MED ORDER — GLIPIZIDE 2.5 MG HALF TABLET: 2.5000 mg | ORAL_TABLET | Freq: Two times a day (BID) | ORAL | Status: DC

## 2013-05-04 MED ORDER — LISINOPRIL 10 MG PO TABS: 10.0000 mg | ORAL_TABLET | Freq: Every day | ORAL | Status: DC

## 2013-05-04 MED ORDER — LEVOTHYROXINE SODIUM 200 MCG PO TABS: 200.0000 ug | ORAL_TABLET | Freq: Every day | ORAL | Status: DC

## 2013-05-04 NOTE — Progress Notes (Signed)
Pt is following up on her HTN and diabetes.

## 2013-05-04 NOTE — Progress Notes (Signed)
MRN: [id number] Name: Ariana Cunningham  Sex: female Age: 43 y.o. DOB: 12/05/1970  Allergies: Review of patient's allergies indicates no known allergies.  Chief Complaint  Patient presents with  . Follow-up    HPI: Patient is 43 y.o. female who has to of diabetes hypertension comes after 6 months for followup patient is requesting refill on her medications she has been taking metformin but has not taken Glucotrol, she denies any hypoglycemic symptoms, today noticed her hemoglobin A1c is trending up, she also history of hypothyroidism and is taking Synthroid 200 mcg daily.  Past Medical History  Diagnosis Date  . Hyperlipidemia   . Diabetes mellitus   . Thyroid condition     Past Surgical History  Procedure Laterality Date  . Cesarean section        Medication List       This list is accurate as of: 05/04/13  3:59 PM.  Always use your most recent med list.               ACCU-CHEK MULTICLIX LANCET DEV Kit  1 kit by Does not apply route 4 (four) times daily -  before meals and at bedtime.     accu-chek multiclix lancets  Use as instructed     glipiZIDE 2.5 mg Tabs tablet  Commonly known as:  GLUCOTROL  Take 0.5 tablets (2.5 mg total) by mouth 2 (two) times daily before a meal.     glucose blood test strip  Commonly known as:  RELION GLUCOSE TEST STRIPS  Use as instructed     glucose blood test strip  Commonly known as:  ACCU-CHEK AVIVA  Use as instructed     glucose monitoring kit monitoring kit  1 each by Does not apply route as needed for other (Any glucometer with q. a.c. at bedtime testing supplies).     ACCU-CHEK AVIVA PLUS W/DEVICE Kit  1 kit by Does not apply route 3 times daily with meals, bedtime and 2 AM.     hydrochlorothiazide 12.5 MG tablet  Commonly known as:  HYDRODIURIL  Take 1 tablet (12.5 mg total) by mouth daily.     levothyroxine 200 MCG tablet  Commonly known as:  SYNTHROID, LEVOTHROID  Take 1 tablet (200 mcg total) by mouth  daily.     lisinopril 10 MG tablet  Commonly known as:  PRINIVIL,ZESTRIL  Take 1 tablet (10 mg total) by mouth daily.     metFORMIN 500 MG 24 hr tablet  Commonly known as:  GLUCOPHAGE XR  Take 2 tablets (1,000 mg total) by mouth 2 (two) times daily with a meal.        Meds ordered this encounter  Medications  . levothyroxine (SYNTHROID, LEVOTHROID) 200 MCG tablet    Sig: Take 1 tablet (200 mcg total) by mouth daily.    Dispense:  30 tablet    Refill:  5  . lisinopril (PRINIVIL,ZESTRIL) 10 MG tablet    Sig: Take 1 tablet (10 mg total) by mouth daily.    Dispense:  30 tablet    Refill:  3  . metFORMIN (GLUCOPHAGE XR) 500 MG 24 hr tablet    Sig: Take 2 tablets (1,000 mg total) by mouth 2 (two) times daily with a meal.    Dispense:  120 tablet    Refill:  4  . glipiZIDE (GLUCOTROL) 2.5 mg TABS tablet    Sig: Take 0.5 tablets (2.5 mg total) by mouth 2 (two) times daily before a meal.  Dispense:  60 tablet    Refill:  3    Immunization History  Administered Date(s) Administered  . Influenza,inj,Quad PF,36+ Mos 11/23/2012    History reviewed. No pertinent family history.  History  Substance Use Topics  . Smoking status: Never Smoker   . Smokeless tobacco: Never Used  . Alcohol Use: No    Review of Systems   As noted in HPI  Filed Vitals:   05/04/13 1535  BP: 128/84  Pulse: 84  Temp: 98.8 F (37.1 C)  Resp: 16    Physical Exam  Physical Exam  Constitutional: No distress.  Eyes: EOM are normal. Pupils are equal, round, and reactive to light.  Cardiovascular: Normal rate and regular rhythm.   Pulmonary/Chest: Breath sounds normal. No respiratory distress. She has no wheezes. She has no rales.  Abdominal: Soft. There is no tenderness.  Musculoskeletal: She exhibits no edema.    CBC    Component Value Date/Time   WBC 6.2 08/23/2012 1006   RBC 3.54* 08/23/2012 1006   HGB 11.5* 08/23/2012 1006   HCT 35.2* 08/23/2012 1006   PLT 286 08/23/2012 1006   MCV  99.4 08/23/2012 1006   LYMPHSABS 1.6 08/21/2007 2020   MONOABS 0.5 08/21/2007 2020   EOSABS 0.2 08/21/2007 2020   BASOSABS 0.0 08/21/2007 2020    CMP     Component Value Date/Time   NA 136 08/23/2012 1006   K 4.4 08/23/2012 1006   CL 103 08/23/2012 1006   CO2 27 08/23/2012 1006   GLUCOSE 164* 08/23/2012 1006   BUN 12 08/23/2012 1006   CREATININE 0.75 08/23/2012 1006   CREATININE 0.70 03/23/2012 1045   CALCIUM 9.4 08/23/2012 1006   PROT 7.3 08/23/2012 1006   ALBUMIN 4.4 08/23/2012 1006   AST 13 08/23/2012 1006   ALT 10 08/23/2012 1006   ALKPHOS 50 08/23/2012 1006   BILITOT 0.4 08/23/2012 1006   GFRNONAA >89 08/23/2012 1006   GFRNONAA >90 03/23/2012 1045   GFRAA >89 08/23/2012 1006   GFRAA >90 03/23/2012 1045    Lab Results  Component Value Date/Time   CHOL 173 08/23/2012 10:06 AM    No components found with this basename: hga1c    Lab Results  Component Value Date/Time   AST 13 08/23/2012 10:06 AM    Assessment and Plan  Diabetes - Results for orders placed in visit on 05/04/13  GLUCOSE, POCT (MANUAL RESULT ENTRY)      Result Value Ref Range   POC Glucose 152 (*) 70 - 99 mg/dl  POCT GLYCOSYLATED HEMOGLOBIN (HGB A1C)      Result Value Ref Range   Hemoglobin A1C 8.6      Plan: Glucose (CBG), HgB A1c, metFORMIN (GLUCOPHAGE XR) 500 MG 24 hr tablet, glipiZIDE (GLUCOTROL) 2.5 mg TABS tablet   patient will continue with metformin 1 g twice a day also resume back on glipizide, advised to continue with fingerstick log.  Unspecified essential hypertension - Plan: lisinopril (PRINIVIL,ZESTRIL) 10 MG tablet  Unspecified hypothyroidism - Plan: Will repeat TSH level  Screening - Plan: CBC with Differential, COMPLETE METABOLIC PANEL WITH GFR, TSH, Lipid panel, Vit D  25 hydroxy (rtn osteoporosis monitoring), levothyroxine (SYNTHROID, LEVOTHROID) 200 MCG tablet   Health Maintenance  -Pap Smear: uptodate  -Mammogram: uptodate    Return in about 3 months (around 08/03/2013) for diabetes,  hypertension.  Lorayne Marek, MD

## 2013-05-05 LAB — TSH: TSH: 21.29 u[IU]/mL — AB (ref 0.350–4.500)

## 2013-05-05 LAB — VITAMIN D 25 HYDROXY (VIT D DEFICIENCY, FRACTURES): Vit D, 25-Hydroxy: <10 ng/mL — ABNORMAL LOW (ref 30–89)

## 2013-05-08 ENCOUNTER — Telehealth: Payer: Self-pay

## 2013-05-08 MED ORDER — VITAMIN D (ERGOCALCIFEROL) 1.25 MG (50000 UNIT) PO CAPS: 50000.0000 [IU] | ORAL_CAPSULE | ORAL | Status: DC

## 2013-05-08 NOTE — Telephone Encounter (Signed)
Patient not available Left message on voice mail to return our call 

## 2013-05-08 NOTE — Telephone Encounter (Signed)
Message copied by Lestine MountJUAREZ, Dave Mannes L on Tue May 08, 2013 11:16 AM ------      Message from: Doris CheadleADVANI, DEEPAK      Created: Tue May 08, 2013 10:34 AM       Blood work reviewed, noticed low vitamin D, call patient advise to start ergocalciferol 50,000 units once a week for the duration of  12 weeks.      Also noticed  elevated TSH level, check with the patient if she is taking her levothyroxine 200 mcg daily if yes then advise patient to increase levothyroxine to 225 mcg daily, (if not) then will continue with 200 mcg and  repeat TSH level on the following visit. ------

## 2013-05-09 ENCOUNTER — Ambulatory Visit: Payer: Medicaid Other | Admitting: Interventional Cardiology

## 2013-05-22 ENCOUNTER — Ambulatory Visit: Payer: Medicaid Other | Admitting: Interventional Cardiology

## 2013-07-26 ENCOUNTER — Ambulatory Visit: Payer: BC Managed Care – PPO | Attending: Internal Medicine | Admitting: Internal Medicine

## 2013-07-26 ENCOUNTER — Encounter: Payer: Self-pay | Admitting: Internal Medicine

## 2013-07-26 VITALS — BP 123/77 | HR 72 | Temp 98.5°F | Resp 16 | Wt 146.2 lb

## 2013-07-26 DIAGNOSIS — E089 Diabetes mellitus due to underlying condition without complications: Secondary | ICD-10-CM

## 2013-07-26 DIAGNOSIS — E039 Hypothyroidism, unspecified: Secondary | ICD-10-CM

## 2013-07-26 DIAGNOSIS — E119 Type 2 diabetes mellitus without complications: Secondary | ICD-10-CM | POA: Insufficient documentation

## 2013-07-26 DIAGNOSIS — Z79899 Other long term (current) drug therapy: Secondary | ICD-10-CM | POA: Diagnosis not present

## 2013-07-26 DIAGNOSIS — J069 Acute upper respiratory infection, unspecified: Secondary | ICD-10-CM

## 2013-07-26 DIAGNOSIS — E559 Vitamin D deficiency, unspecified: Secondary | ICD-10-CM

## 2013-07-26 DIAGNOSIS — J029 Acute pharyngitis, unspecified: Secondary | ICD-10-CM | POA: Diagnosis not present

## 2013-07-26 DIAGNOSIS — E139 Other specified diabetes mellitus without complications: Secondary | ICD-10-CM

## 2013-07-26 DIAGNOSIS — I10 Essential (primary) hypertension: Secondary | ICD-10-CM | POA: Diagnosis not present

## 2013-07-26 DIAGNOSIS — J3489 Other specified disorders of nose and nasal sinuses: Secondary | ICD-10-CM

## 2013-07-26 DIAGNOSIS — R0981 Nasal congestion: Secondary | ICD-10-CM

## 2013-07-26 LAB — POCT RAPID STREP A (OFFICE): Rapid Strep A Screen: NEGATIVE

## 2013-07-26 LAB — POCT GLYCOSYLATED HEMOGLOBIN (HGB A1C): Hemoglobin A1C: 7.4

## 2013-07-26 LAB — GLUCOSE, POCT (MANUAL RESULT ENTRY): POC Glucose: 171 mg/dl — AB (ref 70–99)

## 2013-07-26 MED ORDER — GLIPIZIDE 5 MG PO TABS: 5.0000 mg | ORAL_TABLET | Freq: Two times a day (BID) | ORAL | Status: DC

## 2013-07-26 MED ORDER — FLUTICASONE PROPIONATE 50 MCG/ACT NA SUSP: 2.0000 | Freq: Every day | NASAL | Status: DC

## 2013-07-26 MED ORDER — AZITHROMYCIN 250 MG PO TABS: ORAL_TABLET | ORAL | Status: DC

## 2013-07-26 MED ORDER — VITAMIN D (ERGOCALCIFEROL) 1.25 MG (50000 UNIT) PO CAPS: 50000.0000 [IU] | ORAL_CAPSULE | ORAL | Status: DC

## 2013-07-26 NOTE — Patient Instructions (Signed)
Diabetes Mellitus and Food It is important for you to manage your blood sugar (glucose) level. Your blood glucose level can be greatly affected by what you eat. Eating healthier foods in the appropriate amounts throughout the day at about the same time each day will help you control your blood glucose level. It can also help slow or prevent worsening of your diabetes mellitus. Healthy eating may even help you improve the level of your blood pressure and reach or maintain a healthy weight.  HOW CAN FOOD AFFECT ME? Carbohydrates Carbohydrates affect your blood glucose level more than any other type of food. Your dietitian will help you determine how many carbohydrates to eat at each meal and teach you how to count carbohydrates. Counting carbohydrates is important to keep your blood glucose at a healthy level, especially if you are using insulin or taking certain medicines for diabetes mellitus. Alcohol Alcohol can cause sudden decreases in blood glucose (hypoglycemia), especially if you use insulin or take certain medicines for diabetes mellitus. Hypoglycemia can be a life-threatening condition. Symptoms of hypoglycemia (sleepiness, dizziness, and disorientation) are similar to symptoms of having too much alcohol.  If your health care provider has given you approval to drink alcohol, do so in moderation and use the following guidelines:  Women should not have more than one drink per day, and men should not have more than two drinks per day. One drink is equal to:  12 oz of beer.  5 oz of wine.  1 oz of hard liquor.  Do not drink on an empty stomach.  Keep yourself hydrated. Have water, diet soda, or unsweetened iced tea.  Regular soda, juice, and other mixers might contain a lot of carbohydrates and should be counted. WHAT FOODS ARE NOT RECOMMENDED? As you make food choices, it is important to remember that all foods are not the same. Some foods have fewer nutrients per serving than other  foods, even though they might have the same number of calories or carbohydrates. It is difficult to get your body what it needs when you eat foods with fewer nutrients. Examples of foods that you should avoid that are high in calories and carbohydrates but low in nutrients include:  Trans fats (most processed foods list trans fats on the Nutrition Facts label).  Regular soda.  Juice.  Candy.  Sweets, such as cake, pie, doughnuts, and cookies.  Fried foods. WHAT FOODS CAN I EAT? Have nutrient-rich foods, which will nourish your body and keep you healthy. The food you should eat also will depend on several factors, including:  The calories you need.  The medicines you take.  Your weight.  Your blood glucose level.  Your blood pressure level.  Your cholesterol level. You also should eat a variety of foods, including:  Protein, such as meat, poultry, fish, tofu, nuts, and seeds (lean animal proteins are best).  Fruits.  Vegetables.  Dairy products, such as milk, cheese, and yogurt (low fat is best).  Breads, grains, pasta, cereal, rice, and beans.  Fats such as olive oil, trans fat-free margarine, canola oil, avocado, and olives. DOES EVERYONE WITH DIABETES MELLITUS HAVE THE SAME MEAL PLAN? Because every person with diabetes mellitus is different, there is not one meal plan that works for everyone. It is very important that you meet with a dietitian who will help you create a meal plan that is just right for you. Document Released: 10/08/2004 Document Revised: 01/16/2013 Document Reviewed: 12/08/2012 [employer] Patient Information 2015 [employer]. This   information is not intended to replace advice given to you by your health care provider. Make sure you discuss any questions you have with your health care provider.  

## 2013-07-26 NOTE — Progress Notes (Signed)
Patient complains of sore throat for the past week No temperature

## 2013-07-26 NOTE — Progress Notes (Signed)
MRN: [id number] Name: Ariana Cunningham  Sex: female Age: 43 y.o. DOB: [birth year]  Allergies: Review of patient's allergies indicates no known allergies.  Chief Complaint  Patient presents with  . Sore Throat    HPI: Patient is 43 y.o. female who History of diabetes hypertension hypothyroidism comes today for followup, she denies any hypoglycemic symptoms, her hemoglobin A1c is improved compared to last visit currently taking metformin 1 g twice a day and Glucotrol 2.5 mg 2 times a day, patient had a blood work done which was reviewed with her noticed vitamin D deficiency, for the last one to 2 weeks patient reported to have sore throat nasal congestion postnasal drip denies any fever chills chest and shortness of breath denies any nausea vomiting.  Past Medical History  Diagnosis Date  . Hyperlipidemia   . Diabetes mellitus   . Thyroid condition     Past Surgical History  Procedure Laterality Date  . Cesarean section        Medication List       This list is accurate as of: 07/26/13 10:56 AM.  Always use your most recent med list.               ACCU-CHEK MULTICLIX LANCET DEV Kit  1 kit by Does not apply route 4 (four) times daily -  before meals and at bedtime.     accu-chek multiclix lancets  Use as instructed     azithromycin 250 MG tablet  Commonly known as:  ZITHROMAX Z-PAK  Take as directed     fluticasone 50 MCG/ACT nasal spray  Commonly known as:  FLONASE  Place 2 sprays into both nostrils daily.     glipiZIDE 5 MG tablet  Commonly known as:  GLUCOTROL  Take 1 tablet (5 mg total) by mouth 2 (two) times daily before a meal.     glucose blood test strip  Commonly known as:  RELION GLUCOSE TEST STRIPS  Use as instructed     glucose blood test strip  Commonly known as:  ACCU-CHEK AVIVA  Use as instructed     glucose monitoring kit monitoring kit  1 each by Does not apply route as needed for other (Any glucometer with q. a.c. at bedtime testing  supplies).     ACCU-CHEK AVIVA PLUS W/DEVICE Kit  1 kit by Does not apply route 3 times daily with meals, bedtime and 2 AM.     hydrochlorothiazide 12.5 MG tablet  Commonly known as:  HYDRODIURIL  Take 1 tablet (12.5 mg total) by mouth daily.     levothyroxine 200 MCG tablet  Commonly known as:  SYNTHROID, LEVOTHROID  Take 1 tablet (200 mcg total) by mouth daily.     lisinopril 10 MG tablet  Commonly known as:  PRINIVIL,ZESTRIL  Take 1 tablet (10 mg total) by mouth daily.     metFORMIN 500 MG 24 hr tablet  Commonly known as:  GLUCOPHAGE XR  Take 2 tablets (1,000 mg total) by mouth 2 (two) times daily with a meal.     Vitamin D (Ergocalciferol) 50000 UNITS Caps capsule  Commonly known as:  DRISDOL  Take 1 capsule (50,000 Units total) by mouth every 7 (seven) days.        Meds ordered this encounter  Medications  . Vitamin D, Ergocalciferol, (DRISDOL) 50000 UNITS CAPS capsule    Sig: Take 1 capsule (50,000 Units total) by mouth every 7 (seven) days.    Dispense:  12 capsule  Refill:  0  . glipiZIDE (GLUCOTROL) 5 MG tablet    Sig: Take 1 tablet (5 mg total) by mouth 2 (two) times daily before a meal.    Dispense:  60 tablet    Refill:  3  . azithromycin (ZITHROMAX Z-PAK) 250 MG tablet    Sig: Take as directed    Dispense:  6 each    Refill:  0  . fluticasone (FLONASE) 50 MCG/ACT nasal spray    Sig: Place 2 sprays into both nostrils daily.    Dispense:  16 g    Refill:  6    Immunization History  Administered Date(s) Administered  . Influenza,inj,Quad PF,36+ Mos 11/23/2012    History reviewed. No pertinent family history.  History  Substance Use Topics  . Smoking status: Never Smoker   . Smokeless tobacco: Never Used  . Alcohol Use: No    Review of Systems   As noted in HPI  Filed Vitals:   07/26/13 1021  BP: 123/77  Pulse: 72  Temp: 98.5 F (36.9 C)  Resp: 16    Physical Exam  Physical Exam  HENT:  Minimal pharyngeal erythema no exhibit,  nasal congestion minimal sinus tenderness  Eyes: EOM are normal. Pupils are equal, round, and reactive to light.  Cardiovascular: Normal rate and regular rhythm.   Pulmonary/Chest: Breath sounds normal. No respiratory distress. She has no wheezes. She has no rales.  Musculoskeletal: She exhibits no edema.    CBC    Component Value Date/Time   WBC 5.8 05/04/2013 1601   RBC 3.35* 05/04/2013 1601   HGB 11.2* 05/04/2013 1601   HCT 32.5* 05/04/2013 1601   PLT 259 05/04/2013 1601   MCV 97.0 05/04/2013 1601   LYMPHSABS 2.8 05/04/2013 1601   MONOABS 0.3 05/04/2013 1601   EOSABS 0.1 05/04/2013 1601   BASOSABS 0.1 05/04/2013 1601    CMP     Component Value Date/Time   NA 137 05/04/2013 1601   K 3.9 05/04/2013 1601   CL 101 05/04/2013 1601   CO2 26 05/04/2013 1601   GLUCOSE 131* 05/04/2013 1601   BUN 12 05/04/2013 1601   CREATININE 0.88 05/04/2013 1601   CREATININE 0.70 03/23/2012 1045   CALCIUM 9.0 05/04/2013 1601   PROT 6.8 05/04/2013 1601   ALBUMIN 4.1 05/04/2013 1601   AST 15 05/04/2013 1601   ALT 16 05/04/2013 1601   ALKPHOS 47 05/04/2013 1601   BILITOT 0.4 05/04/2013 1601   GFRNONAA 82 05/04/2013 1601   GFRNONAA >90 03/23/2012 1045   GFRAA >89 05/04/2013 1601   GFRAA >90 03/23/2012 1045    Lab Results  Component Value Date/Time   CHOL 185 05/04/2013  4:01 PM    No components found with this basename: hga1c    Lab Results  Component Value Date/Time   AST 15 05/04/2013  4:01 PM    Assessment and Plan  Diabetes mellitus due to underlying condition without complications - Plan:  Results for orders placed in visit on 07/26/13  GLUCOSE, POCT (MANUAL RESULT ENTRY)      Result Value Ref Range   POC Glucose 171 (*) 70 - 99 mg/dl  POCT GLYCOSYLATED HEMOGLOBIN (HGB A1C)      Result Value Ref Range   Hemoglobin A1C 7.4    POCT RAPID STREP A (OFFICE)      Result Value Ref Range   Rapid Strep A Screen Negative  Negative   Diabetes is improving her hemoglobin A1c is 7.4%, advised for diabetes  meal  planning, continue with her metformin 1 g twice a day have increased the dose of Glucotrol to 5 mg 2 times a day, she will keep the fingerstick log at home., glipiZIDE (GLUCOTROL) 5 MG tablet  Sore throat - Plan: Rapid Strep A is negative, Throat culture Randell Loop) sent, I have advised patient for saltwater gargles.  Unspecified essential hypertension - Plan: Blood pressure is well-controlled continued current medications.   Unspecified hypothyroidism  Currently patient is taking levothyroxine 200 mcg daily Will repeat her TSH level is still elevated consider increasing the dose.  Unspecified vitamin D deficiency - Plan: Started patient on vitamin D supplement her Vitamin D, Ergocalciferol, (DRISDOL) 50000 UNITS CAPS capsule  URI (upper respiratory infection) - Plan: azithromycin (ZITHROMAX Z-PAK) 250 MG tablet, prescribed Flonase for nasal congestion.   Return in about 3 months (around 10/26/2013) for diabetes, hypertension.  Lorayne Marek, MD

## 2013-07-28 LAB — CULTURE, GROUP A STREP: ORGANISM ID, BACTERIA: NORMAL

## 2013-08-03 ENCOUNTER — Ambulatory Visit: Payer: BC Managed Care – PPO | Admitting: Internal Medicine

## 2013-11-26 ENCOUNTER — Encounter: Payer: Self-pay | Admitting: Internal Medicine

## 2014-01-08 ENCOUNTER — Ambulatory Visit: Payer: BC Managed Care – PPO | Attending: Internal Medicine | Admitting: Internal Medicine

## 2014-01-08 ENCOUNTER — Encounter: Payer: Self-pay | Admitting: Internal Medicine

## 2014-01-08 ENCOUNTER — Ambulatory Visit (HOSPITAL_BASED_OUTPATIENT_CLINIC_OR_DEPARTMENT_OTHER): Payer: BC Managed Care – PPO

## 2014-01-08 VITALS — BP 121/80 | HR 70 | Temp 98.0°F | Resp 16 | Wt 152.6 lb

## 2014-01-08 DIAGNOSIS — E039 Hypothyroidism, unspecified: Secondary | ICD-10-CM | POA: Diagnosis present

## 2014-01-08 DIAGNOSIS — I159 Secondary hypertension, unspecified: Secondary | ICD-10-CM | POA: Diagnosis present

## 2014-01-08 DIAGNOSIS — Z23 Encounter for immunization: Secondary | ICD-10-CM | POA: Insufficient documentation

## 2014-01-08 DIAGNOSIS — Z79899 Other long term (current) drug therapy: Secondary | ICD-10-CM | POA: Diagnosis not present

## 2014-01-08 DIAGNOSIS — E119 Type 2 diabetes mellitus without complications: Secondary | ICD-10-CM | POA: Diagnosis not present

## 2014-01-08 DIAGNOSIS — E785 Hyperlipidemia, unspecified: Secondary | ICD-10-CM | POA: Insufficient documentation

## 2014-01-08 DIAGNOSIS — Z139 Encounter for screening, unspecified: Secondary | ICD-10-CM

## 2014-01-08 DIAGNOSIS — E038 Other specified hypothyroidism: Secondary | ICD-10-CM

## 2014-01-08 DIAGNOSIS — E139 Other specified diabetes mellitus without complications: Secondary | ICD-10-CM

## 2014-01-08 LAB — GLUCOSE, POCT (MANUAL RESULT ENTRY): POC Glucose: 161 mg/dl — AB (ref 70–99)

## 2014-01-08 LAB — COMPLETE METABOLIC PANEL WITH GFR
ALT: 12 U/L (ref 0–35)
AST: 15 U/L (ref 0–37)
Albumin: 4.3 g/dL (ref 3.5–5.2)
Alkaline Phosphatase: 50 U/L (ref 39–117)
BUN: 14 mg/dL (ref 6–23)
CALCIUM: 8.8 mg/dL (ref 8.4–10.5)
CHLORIDE: 104 meq/L (ref 96–112)
CO2: 26 meq/L (ref 19–32)
CREATININE: 0.92 mg/dL (ref 0.50–1.10)
GFR, EST AFRICAN AMERICAN: 89 mL/min
GFR, Est Non African American: 78 mL/min
Glucose, Bld: 146 mg/dL — ABNORMAL HIGH (ref 70–99)
Potassium: 4 mEq/L (ref 3.5–5.3)
Sodium: 138 mEq/L (ref 135–145)
Total Bilirubin: 0.3 mg/dL (ref 0.2–1.2)
Total Protein: 7 g/dL (ref 6.0–8.3)

## 2014-01-08 LAB — POCT GLYCOSYLATED HEMOGLOBIN (HGB A1C): HEMOGLOBIN A1C: 7.7

## 2014-01-08 MED ORDER — HYDROCHLOROTHIAZIDE 12.5 MG PO TABS: 12.5000 mg | ORAL_TABLET | Freq: Every day | ORAL | Status: DC

## 2014-01-08 MED ORDER — GLIPIZIDE 10 MG PO TABS: 10.0000 mg | ORAL_TABLET | Freq: Two times a day (BID) | ORAL | Status: DC

## 2014-01-08 MED ORDER — LISINOPRIL 10 MG PO TABS: 10.0000 mg | ORAL_TABLET | Freq: Every day | ORAL | Status: DC

## 2014-01-08 MED ORDER — METFORMIN HCL ER 500 MG PO TB24: 1000.0000 mg | ORAL_TABLET | Freq: Two times a day (BID) | ORAL | Status: DC

## 2014-01-08 NOTE — Patient Instructions (Signed)
Diabetes Mellitus and Food It is important for you to manage your blood sugar (glucose) level. Your blood glucose level can be greatly affected by what you eat. Eating healthier foods in the appropriate amounts throughout the day at about the same time each day will help you control your blood glucose level. It can also help slow or prevent worsening of your diabetes mellitus. Healthy eating may even help you improve the level of your blood pressure and reach or maintain a healthy weight.  HOW CAN FOOD AFFECT ME? Carbohydrates Carbohydrates affect your blood glucose level more than any other type of food. Your dietitian will help you determine how many carbohydrates to eat at each meal and teach you how to count carbohydrates. Counting carbohydrates is important to keep your blood glucose at a healthy level, especially if you are using insulin or taking certain medicines for diabetes mellitus. Alcohol Alcohol can cause sudden decreases in blood glucose (hypoglycemia), especially if you use insulin or take certain medicines for diabetes mellitus. Hypoglycemia can be a life-threatening condition. Symptoms of hypoglycemia (sleepiness, dizziness, and disorientation) are similar to symptoms of having too much alcohol.  If your health care provider has given you approval to drink alcohol, do so in moderation and use the following guidelines:  Women should not have more than one drink per day, and men should not have more than two drinks per day. One drink is equal to:  12 oz of beer.  5 oz of wine.  1 oz of hard liquor.  Do not drink on an empty stomach.  Keep yourself hydrated. Have water, diet soda, or unsweetened iced tea.  Regular soda, juice, and other mixers might contain a lot of carbohydrates and should be counted. WHAT FOODS ARE NOT RECOMMENDED? As you make food choices, it is important to remember that all foods are not the same. Some foods have fewer nutrients per serving than other  foods, even though they might have the same number of calories or carbohydrates. It is difficult to get your body what it needs when you eat foods with fewer nutrients. Examples of foods that you should avoid that are high in calories and carbohydrates but low in nutrients include:  Trans fats (most processed foods list trans fats on the Nutrition Facts label).  Regular soda.  Juice.  Candy.  Sweets, such as cake, pie, doughnuts, and cookies.  Fried foods. WHAT FOODS CAN I EAT? Have nutrient-rich foods, which will nourish your body and keep you healthy. The food you should eat also will depend on several factors, including:  The calories you need.  The medicines you take.  Your weight.  Your blood glucose level.  Your blood pressure level.  Your cholesterol level. You also should eat a variety of foods, including:  Protein, such as meat, poultry, fish, tofu, nuts, and seeds (lean animal proteins are best).  Fruits.  Vegetables.  Dairy products, such as milk, cheese, and yogurt (low fat is best).  Breads, grains, pasta, cereal, rice, and beans.  Fats such as olive oil, trans fat-free margarine, canola oil, avocado, and olives. DOES EVERYONE WITH DIABETES MELLITUS HAVE THE SAME MEAL PLAN? Because every person with diabetes mellitus is different, there is not one meal plan that works for everyone. It is very important that you meet with a dietitian who will help you create a meal plan that is just right for you. Document Released: 10/08/2004 Document Revised: 01/16/2013 Document Reviewed: 12/08/2012 [employer] Patient Information 2015 [employer]. This   information is not intended to replace advice given to you by your health care provider. Make sure you discuss any questions you have with your health care provider.  

## 2014-01-08 NOTE — Progress Notes (Signed)
MRN: [id number] Name: Ariana Cunningham  Sex: female Age: 43 y.o. DOB: Jun 01, 1970  Allergies: Review of patient's allergies indicates no known allergies.  Chief Complaint  Patient presents with  . Follow-up    HPI: Patient is 43 y.o. female who has history of diabetes hypertension hypothyroidism comes today for followup , her blood pressure is well controlled denies any headache dizziness chest and shortness of breath, has been compliant in taking her medications, her hemoglobin A1c noticed to have trended up, currently patient is taking metformin and Glucotrol, denies any hypoglycemic symptoms.  Past Medical History  Diagnosis Date  . Hyperlipidemia   . Diabetes mellitus   . Thyroid condition     Past Surgical History  Procedure Laterality Date  . Cesarean section        Medication List       This list is accurate as of: 01/08/14  4:03 PM.  Always use your most recent med list.               ACCU-CHEK MULTICLIX LANCET DEV Kit  1 kit by Does not apply route 4 (four) times daily -  before meals and at bedtime.     accu-chek multiclix lancets  Use as instructed     azithromycin 250 MG tablet  Commonly known as:  ZITHROMAX Z-PAK  Take as directed     fluticasone 50 MCG/ACT nasal spray  Commonly known as:  FLONASE  Place 2 sprays into both nostrils daily.     glipiZIDE 10 MG tablet  Commonly known as:  GLUCOTROL  Take 1 tablet (10 mg total) by mouth 2 (two) times daily before a meal.     glucose blood test strip  Commonly known as:  RELION GLUCOSE TEST STRIPS  Use as instructed     glucose blood test strip  Commonly known as:  ACCU-CHEK AVIVA  Use as instructed     glucose monitoring kit monitoring kit  1 each by Does not apply route as needed for other (Any glucometer with q. a.c. at bedtime testing supplies).     ACCU-CHEK AVIVA PLUS W/DEVICE Kit  1 kit by Does not apply route 3 times daily with meals, bedtime and 2 AM.     hydrochlorothiazide  12.5 MG tablet  Commonly known as:  HYDRODIURIL  Take 1 tablet (12.5 mg total) by mouth daily.     levothyroxine 200 MCG tablet  Commonly known as:  SYNTHROID, LEVOTHROID  Take 1 tablet (200 mcg total) by mouth daily.     lisinopril 10 MG tablet  Commonly known as:  PRINIVIL,ZESTRIL  Take 1 tablet (10 mg total) by mouth daily.     metFORMIN 500 MG 24 hr tablet  Commonly known as:  GLUCOPHAGE XR  Take 2 tablets (1,000 mg total) by mouth 2 (two) times daily with a meal.     Vitamin D (Ergocalciferol) 50000 UNITS Caps capsule  Commonly known as:  DRISDOL  Take 1 capsule (50,000 Units total) by mouth every 7 (seven) days.        Meds ordered this encounter  Medications  . glipiZIDE (GLUCOTROL) 10 MG tablet    Sig: Take 1 tablet (10 mg total) by mouth 2 (two) times daily before a meal.    Dispense:  60 tablet    Refill:  3  . hydrochlorothiazide (HYDRODIURIL) 12.5 MG tablet    Sig: Take 1 tablet (12.5 mg total) by mouth daily.    Dispense:  30 tablet  Refill:  3  . lisinopril (PRINIVIL,ZESTRIL) 10 MG tablet    Sig: Take 1 tablet (10 mg total) by mouth daily.    Dispense:  30 tablet    Refill:  3  . metFORMIN (GLUCOPHAGE XR) 500 MG 24 hr tablet    Sig: Take 2 tablets (1,000 mg total) by mouth 2 (two) times daily with a meal.    Dispense:  120 tablet    Refill:  4    Immunization History  Administered Date(s) Administered  . Influenza,inj,Quad PF,36+ Mos 11/23/2012, 01/08/2014    History reviewed. No pertinent family history.  History  Substance Use Topics  . Smoking status: Never Smoker   . Smokeless tobacco: Never Used  . Alcohol Use: No    Review of Systems   As noted in HPI  Filed Vitals:   01/08/14 1413  BP: 121/80  Pulse: 70  Temp: 98 F (36.7 C)  Resp: 16    Physical Exam  Physical Exam  Constitutional: No distress.  Eyes: EOM are normal. Pupils are equal, round, and reactive to light.  Cardiovascular: Normal rate and regular rhythm.     Pulmonary/Chest: Breath sounds normal. No respiratory distress. She has no wheezes. She has no rales.  Musculoskeletal: She exhibits no edema.    CBC    Component Value Date/Time   WBC 5.8 05/04/2013 1601   RBC 3.35* 05/04/2013 1601   HGB 11.2* 05/04/2013 1601   HCT 32.5* 05/04/2013 1601   PLT 259 05/04/2013 1601   MCV 97.0 05/04/2013 1601   LYMPHSABS 2.8 05/04/2013 1601   MONOABS 0.3 05/04/2013 1601   EOSABS 0.1 05/04/2013 1601   BASOSABS 0.1 05/04/2013 1601    CMP     Component Value Date/Time   NA 137 05/04/2013 1601   K 3.9 05/04/2013 1601   CL 101 05/04/2013 1601   CO2 26 05/04/2013 1601   GLUCOSE 131* 05/04/2013 1601   BUN 12 05/04/2013 1601   CREATININE 0.88 05/04/2013 1601   CREATININE 0.70 03/23/2012 1045   CALCIUM 9.0 05/04/2013 1601   PROT 6.8 05/04/2013 1601   ALBUMIN 4.1 05/04/2013 1601   AST 15 05/04/2013 1601   ALT 16 05/04/2013 1601   ALKPHOS 47 05/04/2013 1601   BILITOT 0.4 05/04/2013 1601   GFRNONAA 82 05/04/2013 1601   GFRNONAA >90 03/23/2012 1045   GFRAA >89 05/04/2013 1601   GFRAA >90 03/23/2012 1045    Lab Results  Component Value Date/Time   CHOL 185 05/04/2013 04:01 PM    No components found for: HGA1C  Lab Results  Component Value Date/Time   AST 15 05/04/2013 04:01 PM    Assessment and Plan  Other specified diabetes mellitus without complications - Plan:  Results for orders placed or performed in visit on 01/08/14  Glucose (CBG)  Result Value Ref Range   POC Glucose 161 (A) 70 - 99 mg/dl  HgB A1c  Result Value Ref Range   Hemoglobin A1C 7.7    I advised patient for diabetes meal planning, increased the dose of Glucotrol continue with metformin, will repeat A1c in 3 months.Glucose (CBG), HgB A1c, glipiZIDE (GLUCOTROL) 10 MG tablet, metFORMIN (GLUCOPHAGE XR) 500 MG 24 hr tablet   Secondary hypertension, unspecified - Plan:continue with current medications lisinopril (PRINIVIL,ZESTRIL) 10 MG tablet,will repeat her COMPLETE  METABOLIC PANEL WITH GFR  Other specified hypothyroidism - Plan: currently patient is on levothyroxine 200 mcg daily, will check, TSHlevel  Needs flu shot Flu shot given today  Health Maintenance  -  Vaccinations:  Flu shot given today   Return in about 3 months (around 04/09/2014) for diabetes, hypertension.  Lorayne Marek, MD

## 2014-01-08 NOTE — Progress Notes (Signed)
Patient here for follow up on her diabetes and medication refills 

## 2014-01-09 ENCOUNTER — Telehealth: Payer: Self-pay | Admitting: Emergency Medicine

## 2014-01-09 DIAGNOSIS — Z139 Encounter for screening, unspecified: Secondary | ICD-10-CM

## 2014-01-09 LAB — TSH: TSH: 16.068 u[IU]/mL — AB (ref 0.350–4.500)

## 2014-01-09 MED ORDER — LEVOTHYROXINE SODIUM 25 MCG PO TABS: 25.0000 ug | ORAL_TABLET | Freq: Every day | ORAL | Status: DC

## 2014-01-09 NOTE — Telephone Encounter (Signed)
-----   Message from Doris Cheadleeepak Advani, MD sent at 01/09/2014 11:28 AM EST ----- Call and let the patient know that her TSH level is still abnormal,currently patient is on levothyroxine 200 mcg, advise patient to increase the dose of levothyroxine to 225 mcg daily, will repeat TSH level on the following visit.

## 2014-01-09 NOTE — Telephone Encounter (Signed)
Left message with TSH results and medication Levothyroxine changes to 225 mcg daily Medication changed on MAR and e-scribed to pharmacy

## 2014-05-29 ENCOUNTER — Encounter: Payer: Self-pay | Admitting: Internal Medicine

## 2014-05-29 ENCOUNTER — Ambulatory Visit: Payer: BLUE CROSS/BLUE SHIELD | Attending: Internal Medicine | Admitting: Internal Medicine

## 2014-05-29 ENCOUNTER — Other Ambulatory Visit (HOSPITAL_COMMUNITY)
Admission: RE | Admit: 2014-05-29 | Discharge: 2014-05-29 | Disposition: A | Discharge status: Home / Self Care | Payer: BLUE CROSS/BLUE SHIELD | Source: Ambulatory Visit | Attending: Internal Medicine | Admitting: Internal Medicine

## 2014-05-29 VITALS — BP 172/95 | HR 89 | Temp 98.5°F | Resp 16 | Ht 64.0 in | Wt 153.0 lb

## 2014-05-29 DIAGNOSIS — N760 Acute vaginitis: Secondary | ICD-10-CM | POA: Diagnosis present

## 2014-05-29 DIAGNOSIS — Z124 Encounter for screening for malignant neoplasm of cervix: Secondary | ICD-10-CM

## 2014-05-29 DIAGNOSIS — Z01419 Encounter for gynecological examination (general) (routine) without abnormal findings: Secondary | ICD-10-CM | POA: Insufficient documentation

## 2014-05-29 DIAGNOSIS — Z113 Encounter for screening for infections with a predominantly sexual mode of transmission: Secondary | ICD-10-CM | POA: Insufficient documentation

## 2014-05-29 DIAGNOSIS — E119 Type 2 diabetes mellitus without complications: Secondary | ICD-10-CM

## 2014-05-29 DIAGNOSIS — R030 Elevated blood-pressure reading, without diagnosis of hypertension: Secondary | ICD-10-CM | POA: Insufficient documentation

## 2014-05-29 DIAGNOSIS — Z202 Contact with and (suspected) exposure to infections with a predominantly sexual mode of transmission: Secondary | ICD-10-CM

## 2014-05-29 DIAGNOSIS — IMO0001 Reserved for inherently not codable concepts without codable children: Secondary | ICD-10-CM

## 2014-05-29 DIAGNOSIS — N739 Female pelvic inflammatory disease, unspecified: Secondary | ICD-10-CM

## 2014-05-29 LAB — POCT GLYCOSYLATED HEMOGLOBIN (HGB A1C): HEMOGLOBIN A1C: 8.3

## 2014-05-29 LAB — GLUCOSE, POCT (MANUAL RESULT ENTRY): POC GLUCOSE: 145 mg/dL — AB (ref 70–99)

## 2014-05-29 MED ORDER — METRONIDAZOLE 500 MG PO TABS: 2000.0000 mg | ORAL_TABLET | Freq: Once | ORAL | Status: DC

## 2014-05-29 MED ORDER — AZITHROMYCIN 250 MG PO TABS
1000.0000 mg | ORAL_TABLET | Freq: Once | ORAL | Status: AC
  Administered 2014-05-29: 1000 mg via ORAL

## 2014-05-29 MED ORDER — CEFTRIAXONE SODIUM 1 G IJ SOLR
250.0000 mg | Freq: Once | INTRAMUSCULAR | Status: AC
  Administered 2014-05-29: 250 mg via INTRAMUSCULAR

## 2014-05-29 NOTE — Patient Instructions (Signed)
DASH Eating Plan °DASH stands for "Dietary Approaches to Stop Hypertension." The DASH eating plan is a healthy eating plan that has been shown to reduce high blood pressure (hypertension). Additional health benefits may include reducing the risk of type 2 diabetes mellitus, heart disease, and stroke. The DASH eating plan may also help with weight loss. °WHAT DO I NEED TO KNOW ABOUT THE DASH EATING PLAN? °For the DASH eating plan, you will follow these general guidelines: °· Choose foods with a percent daily value for sodium of less than 5% (as listed on the food label). °· Use salt-free seasonings or herbs instead of table salt or sea salt. °· Check with your health care provider or pharmacist before using salt substitutes. °· Eat lower-sodium products, often labeled as "lower sodium" or "no salt added." °· Eat fresh foods. °· Eat more vegetables, fruits, and low-fat dairy products. °· Choose whole grains. Look for the word "whole" as the first word in the ingredient list. °· Choose fish and skinless chicken or turkey more often than red meat. Limit fish, poultry, and meat to 6 oz (170 g) each day. °· Limit sweets, desserts, sugars, and sugary drinks. °· Choose heart-healthy fats. °· Limit cheese to 1 oz (28 g) per day. °· Eat more home-cooked food and less restaurant, buffet, and fast food. °· Limit fried foods. °· Cook foods using methods other than frying. °· Limit canned vegetables. If you do use them, rinse them well to decrease the sodium. °· When eating at a restaurant, ask that your food be prepared with less salt, or no salt if possible. °WHAT FOODS CAN I EAT? °Seek help from a dietitian for individual calorie needs. °Grains °Whole grain or whole wheat bread. Brown rice. Whole grain or whole wheat pasta. Quinoa, bulgur, and whole grain cereals. Low-sodium cereals. Corn or whole wheat flour tortillas. Whole grain cornbread. Whole grain crackers. Low-sodium crackers. °Vegetables °Fresh or frozen vegetables  (raw, steamed, roasted, or grilled). Low-sodium or reduced-sodium tomato and vegetable juices. Low-sodium or reduced-sodium tomato sauce and paste. Low-sodium or reduced-sodium canned vegetables.  °Fruits °All fresh, canned (in natural juice), or frozen fruits. °Meat and Other Protein Products °Ground beef (85% or leaner), grass-fed beef, or beef trimmed of fat. Skinless chicken or turkey. Ground chicken or turkey. Pork trimmed of fat. All fish and seafood. Eggs. Dried beans, peas, or lentils. Unsalted nuts and seeds. Unsalted canned beans. °Dairy °Low-fat dairy products, such as skim or 1% milk, 2% or reduced-fat cheeses, low-fat ricotta or cottage cheese, or plain low-fat yogurt. Low-sodium or reduced-sodium cheeses. °Fats and Oils °Tub margarines without trans fats. Light or reduced-fat mayonnaise and salad dressings (reduced sodium). Avocado. Safflower, olive, or canola oils. Natural peanut or almond butter. °Other °Unsalted popcorn and pretzels. °The items listed above may not be a complete list of recommended foods or beverages. Contact your dietitian for more options. °WHAT FOODS ARE NOT RECOMMENDED? °Grains °White bread. White pasta. White rice. Refined cornbread. Bagels and croissants. Crackers that contain trans fat. °Vegetables °Creamed or fried vegetables. Vegetables in a cheese sauce. Regular canned vegetables. Regular canned tomato sauce and paste. Regular tomato and vegetable juices. °Fruits °Dried fruits. Canned fruit in light or heavy syrup. Fruit juice. °Meat and Other Protein Products °Fatty cuts of meat. Ribs, chicken wings, bacon, sausage, bologna, salami, chitterlings, fatback, hot dogs, bratwurst, and packaged luncheon meats. Salted nuts and seeds. Canned beans with salt. °Dairy °Whole or 2% milk, cream, half-and-half, and cream cheese. Whole-fat or sweetened yogurt. Full-fat   cheeses or blue cheese. Nondairy creamers and whipped toppings. Processed cheese, cheese spreads, or cheese  curds. °Condiments °Onion and garlic salt, seasoned salt, table salt, and sea salt. Canned and packaged gravies. Worcestershire sauce. Tartar sauce. Barbecue sauce. Teriyaki sauce. Soy sauce, including reduced sodium. Steak sauce. Fish sauce. Oyster sauce. Cocktail sauce. Horseradish. Ketchup and mustard. Meat flavorings and tenderizers. Bouillon cubes. Hot sauce. Tabasco sauce. Marinades. Taco seasonings. Relishes. °Fats and Oils °Butter, stick margarine, lard, shortening, ghee, and bacon fat. Coconut, palm kernel, or palm oils. Regular salad dressings. °Other °Pickles and olives. Salted popcorn and pretzels. °The items listed above may not be a complete list of foods and beverages to avoid. Contact your dietitian for more information. °WHERE CAN I FIND MORE INFORMATION? °National [employer], [employer], [employer] Institute: www.nhlbi.nih.gov/health/health-topics/topics/dash/ °Document Released: 12/31/2010 Document Revised: 05/28/2013 Document Reviewed: 11/15/2012 °ExitCare® Patient Information ©2015 [employer]. This information is not intended to replace advice given to you by your health care provider. Make sure you discuss any questions you have with your health care provider. ° °

## 2014-05-29 NOTE — Progress Notes (Signed)
Patient ID: Ariana Cunningham, female   DOB: August 26, 1970, 44 y.o.   MRN: [id number]  CC:  HPI: Ariana Cunningham is a 44 y.o. female here today for a pap smear.  Patient has past medical history of T2DM, thyroid disease, and HTN.  Patient reports that she has been unable to get in with her PCP and reports that she may need medication changes. Today she is concerned about a possible STD exposure. She states that for the past 3 days she has noticed a yellow discharge and a very foul odor from her vagina.   Patient has No chest pain, No abdominal pain - No Nausea, No new weakness tingling or numbness, No Cough - SOB.  No Known Allergies Past Medical History  Diagnosis Date  . Hyperlipidemia   . Diabetes mellitus   . Thyroid condition    Current Outpatient Prescriptions on File Prior to Visit  Medication Sig Dispense Refill  . Blood Glucose Monitoring Suppl (ACCU-CHEK AVIVA PLUS) W/DEVICE KIT 1 kit by Does not apply route 3 times daily with meals, bedtime and 2 AM. 1 kit 3  . fluticasone (FLONASE) 50 MCG/ACT nasal spray Place 2 sprays into both nostrils daily. 16 g 6  . glipiZIDE (GLUCOTROL) 10 MG tablet Take 1 tablet (10 mg total) by mouth 2 (two) times daily before a meal. 60 tablet 3  . glucose blood (ACCU-CHEK AVIVA) test strip Use as instructed 100 each 12  . glucose blood (RELION GLUCOSE TEST STRIPS) test strip Use as instructed 100 each 12  . glucose monitoring kit (FREESTYLE) monitoring kit 1 each by Does not apply route as needed for other (Any glucometer with q. a.c. at bedtime testing supplies). 1 each 3  . hydrochlorothiazide (HYDRODIURIL) 12.5 MG tablet Take 1 tablet (12.5 mg total) by mouth daily. 30 tablet 3  . Lancets (ACCU-CHEK MULTICLIX) lancets Use as instructed 100 each 12  . Lancets Misc. (ACCU-CHEK MULTICLIX LANCET DEV) KIT 1 kit by Does not apply route 4 (four) times daily -  before meals and at bedtime. 1 each 12  . levothyroxine (SYNTHROID, LEVOTHROID) 200 MCG tablet Take 1  tablet (200 mcg total) by mouth daily. 30 tablet 5  . levothyroxine (SYNTHROID, LEVOTHROID) 25 MCG tablet Take 1 tablet (25 mcg total) by mouth daily before breakfast. 30 tablet 2  . lisinopril (PRINIVIL,ZESTRIL) 10 MG tablet Take 1 tablet (10 mg total) by mouth daily. 30 tablet 3  . metFORMIN (GLUCOPHAGE XR) 500 MG 24 hr tablet Take 2 tablets (1,000 mg total) by mouth 2 (two) times daily with a meal. 120 tablet 4  . azithromycin (ZITHROMAX Z-PAK) 250 MG tablet Take as directed (Patient not taking: Reported on 05/29/2014) 6 each 0  . Vitamin D, Ergocalciferol, (DRISDOL) 50000 UNITS CAPS capsule Take 1 capsule (50,000 Units total) by mouth every 7 (seven) days. (Patient not taking: Reported on 05/29/2014) 12 capsule 0   No current facility-administered medications on file prior to visit.   History reviewed. No pertinent family history. History   Social History  . Marital Status: Single    Spouse Name: N/A  . Number of Children: N/A  . Years of Education: N/A   Occupational History  . Not on file.   Social History Main Topics  . Smoking status: Never Smoker   . Smokeless tobacco: Never Used  . Alcohol Use: No  . Drug Use: No  . Sexual Activity:    Partners: Male    Patent examiner Protection: IUD   Other  Topics Concern  . Not on file   Social History Narrative    Review of Systems: See HPI    Objective:   Filed Vitals:   05/29/14 1532  BP: 172/95  Pulse: 89  Temp: 98.5 F (36.9 C)  Resp: 16    Physical Exam  Pulmonary/Chest: Right breast exhibits no mass. Left breast exhibits no mass.  Genitourinary: Uterus normal. No breast tenderness or discharge. Cervix exhibits motion tenderness, discharge and friability. Right adnexum displays no tenderness. Left adnexum displays no tenderness. Vaginal discharge (very foul smelling ) found.  Lymphadenopathy:       Right: No inguinal adenopathy present.       Left: No inguinal adenopathy present.     Lab Results  Component  Value Date   WBC 5.8 05/04/2013   HGB 11.2* 05/04/2013   HCT 32.5* 05/04/2013   MCV 97.0 05/04/2013   PLT 259 05/04/2013   Lab Results  Component Value Date   CREATININE 0.92 01/08/2014   BUN 14 01/08/2014   NA 138 01/08/2014   K 4.0 01/08/2014   CL 104 01/08/2014   CO2 26 01/08/2014    Lab Results  Component Value Date   HGBA1C 7.7 01/08/2014   Lipid Panel     Component Value Date/Time   CHOL 185 05/04/2013 1601   TRIG 97 05/04/2013 1601   HDL 58 05/04/2013 1601   CHOLHDL 3.2 05/04/2013 1601   VLDL 19 05/04/2013 1601   LDLCALC 108* 05/04/2013 1601       Assessment and plan:   Ariana Cunningham was seen today for follow-up.  Diagnoses and all orders for this visit:  Papanicolaou smear Orders: -     Cytology - PAP [place] -     HIV antibody (with reflex) -     RPR  PID (pelvic inflammatory disease) See below, I will treat for every disease. Likely Trich or chlamydia   STD exposure Orders: -     cefTRIAXone (ROCEPHIN) injection 250 mg; Inject 0.25 g (250 mg total) into the muscle once and azithromycin (ZITHROMAX) tablet 1,000 mg; Take 4 tablets (1,000 mg total) by mouth once in office. -     metroNIDAZOLE (FLAGYL) 500 MG tablet; Take 4 tablets (2,000 mg total) by mouth once. Patient will begin Flagyl tomorrow to minimize GI upset.  Elevated BP Will defer to PCP. Patient did not take BP medication today  Type 2 diabetes mellitus without complication Orders: -     Glucose (CBG) -     HgB A1c Will defer to PCP  Return in about 1 week (around 06/05/2014) for Advani for , DM/HTN.       Ariana Manning, NP-C [facility] [phone] 05/29/2014, 3:47 PM

## 2014-05-29 NOTE — Progress Notes (Signed)
Pt is here for a pap smear. 

## 2014-05-30 LAB — RPR

## 2014-05-30 LAB — HIV ANTIBODY (ROUTINE TESTING W REFLEX): HIV 1&2 Ab, 4th Generation: NONREACTIVE

## 2014-05-31 LAB — CERVICOVAGINAL ANCILLARY ONLY
CHLAMYDIA, DNA PROBE: NEGATIVE
Neisseria Gonorrhea: NEGATIVE
Wet Prep (BD Affirm): POSITIVE — AB

## 2014-05-31 LAB — CYTOLOGY - PAP

## 2014-06-03 ENCOUNTER — Telehealth: Payer: Self-pay | Admitting: *Deleted

## 2014-06-03 NOTE — Telephone Encounter (Signed)
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 She will need to notify all partners so they may get treated. It is very essential for patient to tell partners specifically what they have been exposed to because this is a disease that is not commonly tested in men. Most do not know they have a disease unless the women alerts them because testing is unreliable in men. I have already treated her, make sure she took the treatment.

## 2014-06-03 NOTE — Telephone Encounter (Signed)
Left VM for pt to return my call  

## 2014-06-05 ENCOUNTER — Ambulatory Visit: Payer: BLUE CROSS/BLUE SHIELD | Attending: Internal Medicine | Admitting: Internal Medicine

## 2014-06-05 ENCOUNTER — Encounter: Payer: Self-pay | Admitting: Internal Medicine

## 2014-06-05 VITALS — BP 120/79 | HR 81 | Temp 99.1°F | Resp 15 | Wt 152.0 lb

## 2014-06-05 DIAGNOSIS — I159 Secondary hypertension, unspecified: Secondary | ICD-10-CM

## 2014-06-05 DIAGNOSIS — E785 Hyperlipidemia, unspecified: Secondary | ICD-10-CM | POA: Insufficient documentation

## 2014-06-05 DIAGNOSIS — Z139 Encounter for screening, unspecified: Secondary | ICD-10-CM

## 2014-06-05 DIAGNOSIS — E038 Other specified hypothyroidism: Secondary | ICD-10-CM | POA: Diagnosis not present

## 2014-06-05 DIAGNOSIS — E119 Type 2 diabetes mellitus without complications: Secondary | ICD-10-CM | POA: Insufficient documentation

## 2014-06-05 DIAGNOSIS — I10 Essential (primary) hypertension: Secondary | ICD-10-CM

## 2014-06-05 DIAGNOSIS — E139 Other specified diabetes mellitus without complications: Secondary | ICD-10-CM | POA: Diagnosis not present

## 2014-06-05 DIAGNOSIS — Z7951 Long term (current) use of inhaled steroids: Secondary | ICD-10-CM | POA: Insufficient documentation

## 2014-06-05 LAB — GLUCOSE, POCT (MANUAL RESULT ENTRY): POC GLUCOSE: 140 mg/dL — AB (ref 70–99)

## 2014-06-05 MED ORDER — METFORMIN HCL ER 500 MG PO TB24: 1000.0000 mg | ORAL_TABLET | Freq: Two times a day (BID) | ORAL | Status: DC

## 2014-06-05 MED ORDER — LEVOTHYROXINE SODIUM 25 MCG PO TABS: 25.0000 ug | ORAL_TABLET | Freq: Every day | ORAL | Status: DC

## 2014-06-05 MED ORDER — LEVOTHYROXINE SODIUM 200 MCG PO TABS: 200.0000 ug | ORAL_TABLET | Freq: Every day | ORAL | Status: DC

## 2014-06-05 MED ORDER — LISINOPRIL 10 MG PO TABS: 10.0000 mg | ORAL_TABLET | Freq: Every day | ORAL | Status: DC

## 2014-06-05 MED ORDER — HYDROCHLOROTHIAZIDE 12.5 MG PO TABS: 12.5000 mg | ORAL_TABLET | Freq: Every day | ORAL | Status: DC

## 2014-06-05 MED ORDER — GLIPIZIDE 10 MG PO TABS: 10.0000 mg | ORAL_TABLET | Freq: Two times a day (BID) | ORAL | Status: DC

## 2014-06-05 NOTE — Progress Notes (Signed)
Patient here for follow up on her diabetes Hypertension and thyroid disease

## 2014-06-05 NOTE — Progress Notes (Signed)
MRN: [id number] Name: Ariana Cunningham  Sex: female Age: 44 y.o. DOB: 09-29-1970  Allergies: Review of patient's allergies indicates no known allergies.  Chief Complaint  Patient presents with  . Follow-up    HPI: Patient is 44 y.o. female who has to of hypertension diabetes, hypothyroidism, comes today for followup, previous blood work reviewed, on the last visit she was advised to increase the dose of levothyroxine 225 mcg daily, she reported she is only taking 200 mcg, she denies any acute symptoms, her blood pressure is well controlled, denies any headache dizziness chest and shortness of breath.   Past Medical History  Diagnosis Date  . Hyperlipidemia   . Diabetes mellitus   . Thyroid condition     Past Surgical History  Procedure Laterality Date  . Cesarean section        Medication List       This list is accurate as of: 06/05/14  5:34 PM.  Always use your most recent med list.               ACCU-CHEK MULTICLIX LANCET DEV Kit  1 kit by Does not apply route 4 (four) times daily -  before meals and at bedtime.     accu-chek multiclix lancets  Use as instructed     azithromycin 250 MG tablet  Commonly known as:  ZITHROMAX Z-PAK  Take as directed     fluticasone 50 MCG/ACT nasal spray  Commonly known as:  FLONASE  Place 2 sprays into both nostrils daily.     glipiZIDE 10 MG tablet  Commonly known as:  GLUCOTROL  Take 1 tablet (10 mg total) by mouth 2 (two) times daily before a meal.     glucose blood test strip  Commonly known as:  RELION GLUCOSE TEST STRIPS  Use as instructed     glucose blood test strip  Commonly known as:  ACCU-CHEK AVIVA  Use as instructed     glucose monitoring kit monitoring kit  1 each by Does not apply route as needed for other (Any glucometer with q. a.c. at bedtime testing supplies).     ACCU-CHEK AVIVA PLUS W/DEVICE Kit  1 kit by Does not apply route 3 times daily with meals, bedtime and 2 AM.     hydrochlorothiazide 12.5 MG tablet  Commonly known as:  HYDRODIURIL  Take 1 tablet (12.5 mg total) by mouth daily.     levothyroxine 25 MCG tablet  Commonly known as:  SYNTHROID, LEVOTHROID  Take 1 tablet (25 mcg total) by mouth daily before breakfast.     levothyroxine 200 MCG tablet  Commonly known as:  SYNTHROID, LEVOTHROID  Take 1 tablet (200 mcg total) by mouth daily.     lisinopril 10 MG tablet  Commonly known as:  PRINIVIL,ZESTRIL  Take 1 tablet (10 mg total) by mouth daily.     metFORMIN 500 MG 24 hr tablet  Commonly known as:  GLUCOPHAGE XR  Take 2 tablets (1,000 mg total) by mouth 2 (two) times daily with a meal.     metroNIDAZOLE 500 MG tablet  Commonly known as:  FLAGYL  Take 4 tablets (2,000 mg total) by mouth once.     Vitamin D (Ergocalciferol) 50000 UNITS Caps capsule  Commonly known as:  DRISDOL  Take 1 capsule (50,000 Units total) by mouth every 7 (seven) days.        Meds ordered this encounter  Medications  . levothyroxine (SYNTHROID, LEVOTHROID) 25 MCG tablet  Sig: Take 1 tablet (25 mcg total) by mouth daily before breakfast.    Dispense:  30 tablet    Refill:  4  . metFORMIN (GLUCOPHAGE XR) 500 MG 24 hr tablet    Sig: Take 2 tablets (1,000 mg total) by mouth 2 (two) times daily with a meal.    Dispense:  120 tablet    Refill:  4  . lisinopril (PRINIVIL,ZESTRIL) 10 MG tablet    Sig: Take 1 tablet (10 mg total) by mouth daily.    Dispense:  30 tablet    Refill:  3  . levothyroxine (SYNTHROID, LEVOTHROID) 200 MCG tablet    Sig: Take 1 tablet (200 mcg total) by mouth daily.    Dispense:  30 tablet    Refill:  5  . glipiZIDE (GLUCOTROL) 10 MG tablet    Sig: Take 1 tablet (10 mg total) by mouth 2 (two) times daily before a meal.    Dispense:  60 tablet    Refill:  3  . hydrochlorothiazide (HYDRODIURIL) 12.5 MG tablet    Sig: Take 1 tablet (12.5 mg total) by mouth daily.    Dispense:  30 tablet    Refill:  3    Immunization History    Administered Date(s) Administered  . Influenza,inj,Quad PF,36+ Mos 11/23/2012, 01/08/2014    History reviewed. No pertinent family history.  History  Substance Use Topics  . Smoking status: Never Smoker   . Smokeless tobacco: Never Used  . Alcohol Use: No    Review of Systems   As noted in HPI  Filed Vitals:   06/05/14 1656  BP: 120/79  Pulse: 81  Temp: 99.1 F (37.3 C)  Resp: 15    Physical Exam  Physical Exam  Constitutional: No distress.  Eyes: EOM are normal. Pupils are equal, round, and reactive to light.  Cardiovascular: Normal rate and regular rhythm.   Pulmonary/Chest: Breath sounds normal. No respiratory distress. She has no wheezes. She has no rales.  Musculoskeletal: She exhibits no edema.    CBC    Component Value Date/Time   WBC 5.8 05/04/2013 1601   RBC 3.35* 05/04/2013 1601   HGB 11.2* 05/04/2013 1601   HCT 32.5* 05/04/2013 1601   PLT 259 05/04/2013 1601   MCV 97.0 05/04/2013 1601   LYMPHSABS 2.8 05/04/2013 1601   MONOABS 0.3 05/04/2013 1601   EOSABS 0.1 05/04/2013 1601   BASOSABS 0.1 05/04/2013 1601    CMP     Component Value Date/Time   NA 138 01/08/2014 1455   K 4.0 01/08/2014 1455   CL 104 01/08/2014 1455   CO2 26 01/08/2014 1455   GLUCOSE 146* 01/08/2014 1455   BUN 14 01/08/2014 1455   CREATININE 0.92 01/08/2014 1455   CREATININE 0.70 03/23/2012 1045   CALCIUM 8.8 01/08/2014 1455   PROT 7.0 01/08/2014 1455   ALBUMIN 4.3 01/08/2014 1455   AST 15 01/08/2014 1455   ALT 12 01/08/2014 1455   ALKPHOS 50 01/08/2014 1455   BILITOT 0.3 01/08/2014 1455   GFRNONAA 78 01/08/2014 1455   GFRNONAA >90 03/23/2012 1045   GFRAA 89 01/08/2014 1455   GFRAA >90 03/23/2012 1045    Lab Results  Component Value Date/Time   CHOL 185 05/04/2013 04:01 PM    Lab Results  Component Value Date/Time   HGBA1C 8.3 05/29/2014 03:53 PM   HGBA1C 7.8* 05/24/2012 04:19 PM    Lab Results  Component Value Date/Time   AST 15 01/08/2014 02:55 PM  Assessment and Plan  Other specified diabetes mellitus without complications - Plan:  Results for orders placed or performed in visit on 06/05/14  Glucose (CBG)  Result Value Ref Range   POC Glucose 140 (A) 70 - 99 mg/dl   Recent hemoglobin A1c was 8.3%, she's currently on Glucotrol and metformin, advise patient for diabetes meal planning, discussed about adding new medication, she wants to hold off until next time, she is given handout for diabetes meal, COMPLETE METABOLIC PANEL WITH GFR, metFORMIN (GLUCOPHAGE XR) 500 MG 24 hr tablet, glipiZIDE (GLUCOTROL) 10 MG tablet  Other specified hypothyroidism - Plan: will repeatTSH,currently she is taking levothyroxine 200 mcg daily, she was advised to take 225 mcg, will recheck thyroid level on the following visit levothyroxine (SYNTHROID, LEVOTHROID) 25 MCG tablet,    hypertension, unspecified - Plan: lisinopril (PRINIVIL,ZESTRIL) 10 MG tablet and hydrochlorothiazide, repeat blood chemistry   Return in about 3 months (around 09/05/2014) for diabetes, hypertension.   This note has been created with Surveyor, quantity. Any transcriptional errors are unintentional.    Lorayne Marek, MD

## 2014-06-05 NOTE — Patient Instructions (Signed)
Diabetes Mellitus and Food It is important for you to manage your blood sugar (glucose) level. Your blood glucose level can be greatly affected by what you eat. Eating healthier foods in the appropriate amounts throughout the day at about the same time each day will help you control your blood glucose level. It can also help slow or prevent worsening of your diabetes mellitus. Healthy eating may even help you improve the level of your blood pressure and reach or maintain a healthy weight.  HOW CAN FOOD AFFECT ME? Carbohydrates Carbohydrates affect your blood glucose level more than any other type of food. Your dietitian will help you determine how many carbohydrates to eat at each meal and teach you how to count carbohydrates. Counting carbohydrates is important to keep your blood glucose at a healthy level, especially if you are using insulin or taking certain medicines for diabetes mellitus. Alcohol Alcohol can cause sudden decreases in blood glucose (hypoglycemia), especially if you use insulin or take certain medicines for diabetes mellitus. Hypoglycemia can be a life-threatening condition. Symptoms of hypoglycemia (sleepiness, dizziness, and disorientation) are similar to symptoms of having too much alcohol.  If your health care provider has given you approval to drink alcohol, do so in moderation and use the following guidelines:  Women should not have more than one drink per day, and men should not have more than two drinks per day. One drink is equal to:  12 oz of beer.  5 oz of wine.  1 oz of hard liquor.  Do not drink on an empty stomach.  Keep yourself hydrated. Have water, diet soda, or unsweetened iced tea.  Regular soda, juice, and other mixers might contain a lot of carbohydrates and should be counted. WHAT FOODS ARE NOT RECOMMENDED? As you make food choices, it is important to remember that all foods are not the same. Some foods have fewer nutrients per serving than other  foods, even though they might have the same number of calories or carbohydrates. It is difficult to get your body what it needs when you eat foods with fewer nutrients. Examples of foods that you should avoid that are high in calories and carbohydrates but low in nutrients include:  Trans fats (most processed foods list trans fats on the Nutrition Facts label).  Regular soda.  Juice.  Candy.  Sweets, such as cake, pie, doughnuts, and cookies.  Fried foods. WHAT FOODS CAN I EAT? Have nutrient-rich foods, which will nourish your body and keep you healthy. The food you should eat also will depend on several factors, including:  The calories you need.  The medicines you take.  Your weight.  Your blood glucose level.  Your blood pressure level.  Your cholesterol level. You also should eat a variety of foods, including:  Protein, such as meat, poultry, fish, tofu, nuts, and seeds (lean animal proteins are best).  Fruits.  Vegetables.  Dairy products, such as milk, cheese, and yogurt (low fat is best).  Breads, grains, pasta, cereal, rice, and beans.  Fats such as olive oil, trans fat-free margarine, canola oil, avocado, and olives. DOES EVERYONE WITH DIABETES MELLITUS HAVE THE SAME MEAL PLAN? Because every person with diabetes mellitus is different, there is not one meal plan that works for everyone. It is very important that you meet with a dietitian who will help you create a meal plan that is just right for you. Document Released: 10/08/2004 Document Revised: 01/16/2013 Document Reviewed: 12/08/2012 Exit[employer] Patient Information 2015 [employer]. This   information is not intended to replace advice given to you by your health care provider. Make sure you discuss any questions you have with your health care provider. DASH Eating Plan DASH stands for "Dietary Approaches to Stop Hypertension." The DASH eating plan is a healthy eating plan that has been shown to reduce high  blood pressure (hypertension). Additional health benefits may include reducing the risk of type 2 diabetes mellitus, heart disease, and stroke. The DASH eating plan may also help with weight loss. WHAT DO I NEED TO KNOW ABOUT THE DASH EATING PLAN? For the DASH eating plan, you will follow these general guidelines:  Choose foods with a percent daily value for sodium of less than 5% (as listed on the food label).  Use salt-free seasonings or herbs instead of table salt or sea salt.  Check with your health care provider or pharmacist before using salt substitutes.  Eat lower-sodium products, often labeled as "lower sodium" or "no salt added."  Eat fresh foods.  Eat more vegetables, fruits, and low-fat dairy products.  Choose whole grains. Look for the word "whole" as the first word in the ingredient list.  Choose fish and skinless chicken or turkey more often than red meat. Limit fish, poultry, and meat to 6 oz (170 g) each day.  Limit sweets, desserts, sugars, and sugary drinks.  Choose heart-healthy fats.  Limit cheese to 1 oz (28 g) per day.  Eat more home-cooked food and less restaurant, buffet, and fast food.  Limit fried foods.  Cook foods using methods other than frying.  Limit canned vegetables. If you do use them, rinse them well to decrease the sodium.  When eating at a restaurant, ask that your food be prepared with less salt, or no salt if possible. WHAT FOODS CAN I EAT? Seek help from a dietitian for individual calorie needs. Grains Whole grain or whole wheat bread. Brown rice. Whole grain or whole wheat pasta. Quinoa, bulgur, and whole grain cereals. Low-sodium cereals. Corn or whole wheat flour tortillas. Whole grain cornbread. Whole grain crackers. Low-sodium crackers. Vegetables Fresh or frozen vegetables (raw, steamed, roasted, or grilled). Low-sodium or reduced-sodium tomato and vegetable juices. Low-sodium or reduced-sodium tomato sauce and paste. Low-sodium  or reduced-sodium canned vegetables.  Fruits All fresh, canned (in natural juice), or frozen fruits. Meat and Other Protein Products Ground beef (85% or leaner), grass-fed beef, or beef trimmed of fat. Skinless chicken or turkey. Ground chicken or turkey. Pork trimmed of fat. All fish and seafood. Eggs. Dried beans, peas, or lentils. Unsalted nuts and seeds. Unsalted canned beans. Dairy Low-fat dairy products, such as skim or 1% milk, 2% or reduced-fat cheeses, low-fat ricotta or cottage cheese, or plain low-fat yogurt. Low-sodium or reduced-sodium cheeses. Fats and Oils Tub margarines without trans fats. Light or reduced-fat mayonnaise and salad dressings (reduced sodium). Avocado. Safflower, olive, or canola oils. Natural peanut or almond butter. Other Unsalted popcorn and pretzels. The items listed above may not be a complete list of recommended foods or beverages. Contact your dietitian for more options. WHAT FOODS ARE NOT RECOMMENDED? Grains White bread. White pasta. White rice. Refined cornbread. Bagels and croissants. Crackers that contain trans fat. Vegetables Creamed or fried vegetables. Vegetables in a cheese sauce. Regular canned vegetables. Regular canned tomato sauce and paste. Regular tomato and vegetable juices. Fruits Dried fruits. Canned fruit in light or heavy syrup. Fruit juice. Meat and Other Protein Products Fatty cuts of meat. Ribs, chicken wings, bacon, sausage, bologna, salami, chitterlings, fatback, hot   dogs, bratwurst, and packaged luncheon meats. Salted nuts and seeds. Canned beans with salt. Dairy Whole or 2% milk, cream, half-and-half, and cream cheese. Whole-fat or sweetened yogurt. Full-fat cheeses or blue cheese. Nondairy creamers and whipped toppings. Processed cheese, cheese spreads, or cheese curds. Condiments Onion and garlic salt, seasoned salt, table salt, and sea salt. Canned and packaged gravies. Worcestershire sauce. Tartar sauce. Barbecue sauce.  Teriyaki sauce. Soy sauce, including reduced sodium. Steak sauce. Fish sauce. Oyster sauce. Cocktail sauce. Horseradish. Ketchup and mustard. Meat flavorings and tenderizers. Bouillon cubes. Hot sauce. Tabasco sauce. Marinades. Taco seasonings. Relishes. Fats and Oils Butter, stick margarine, lard, shortening, ghee, and bacon fat. Coconut, palm kernel, or palm oils. Regular salad dressings. Other Pickles and olives. Salted popcorn and pretzels. The items listed above may not be a complete list of foods and beverages to avoid. Contact your dietitian for more information. WHERE CAN I FIND MORE INFORMATION? National [employer], [employer], [employer]: www.nhlbi.nih.gov/health/health-topics/topics/dash/ Document Released: 12/31/2010 Document Revised: 05/28/2013 Document Reviewed: 11/15/2012 Exit[employer] Patient Information 2015 [employer]. This information is not intended to replace advice given to you by your health care provider. Make sure you discuss any questions you have with your health care provider.  

## 2014-06-06 LAB — COMPLETE METABOLIC PANEL WITH GFR
ALK PHOS: 57 U/L (ref 39–117)
ALT: 18 U/L (ref 0–35)
AST: 24 U/L (ref 0–37)
Albumin: 4.5 g/dL (ref 3.5–5.2)
BILIRUBIN TOTAL: 0.5 mg/dL (ref 0.2–1.2)
BUN: 17 mg/dL (ref 6–23)
CALCIUM: 9.3 mg/dL (ref 8.4–10.5)
CO2: 26 mEq/L (ref 19–32)
Chloride: 102 mEq/L (ref 96–112)
Creat: 1.1 mg/dL (ref 0.50–1.10)
GFR, Est African American: 72 mL/min
GFR, Est Non African American: 63 mL/min
Glucose, Bld: 141 mg/dL — ABNORMAL HIGH (ref 70–99)
Potassium: 4.2 mEq/L (ref 3.5–5.3)
SODIUM: 136 meq/L (ref 135–145)
Total Protein: 7.8 g/dL (ref 6.0–8.3)

## 2014-06-06 LAB — TSH: TSH: 40.29 u[IU]/mL — ABNORMAL HIGH (ref 0.350–4.500)

## 2014-06-17 ENCOUNTER — Other Ambulatory Visit: Payer: Self-pay

## 2014-12-16 IMAGING — MG MM DIGITAL SCREENING BILAT
5 series · 5 of 5 positions shown · non-contrast
Comparison: None.

CLINICAL DATA: Screening.

EXAM:
DIGITAL SCREENING BILATERAL MAMMOGRAM WITH CAD

[R CC]
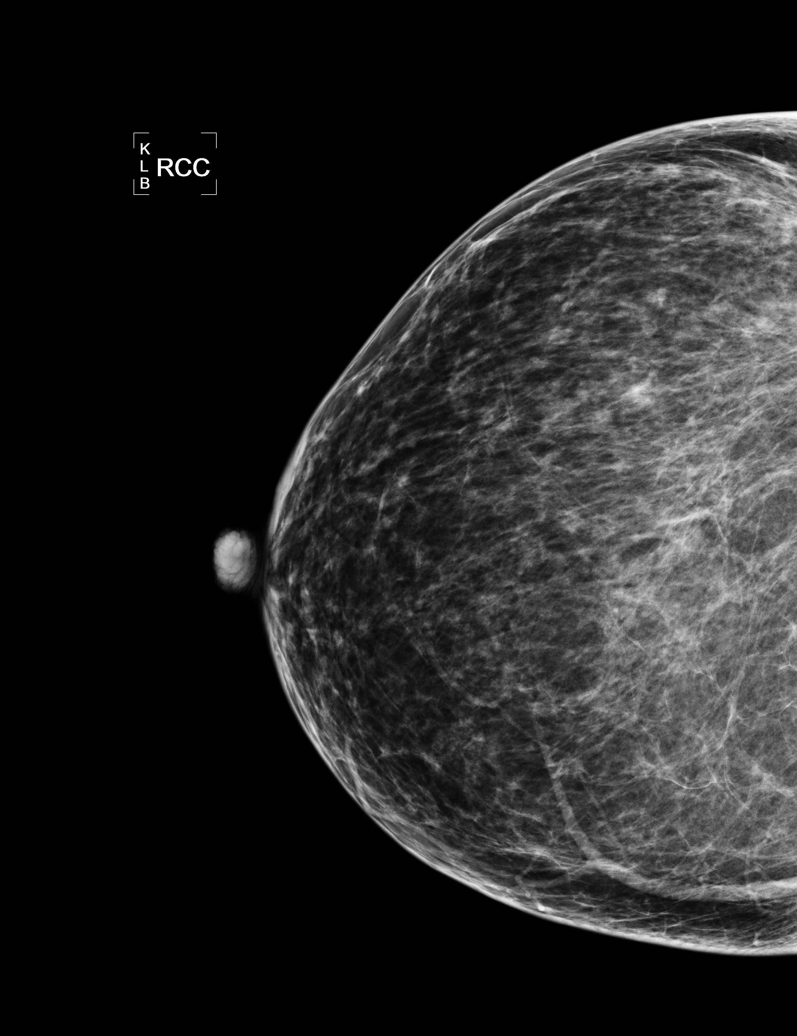

[R MLO]
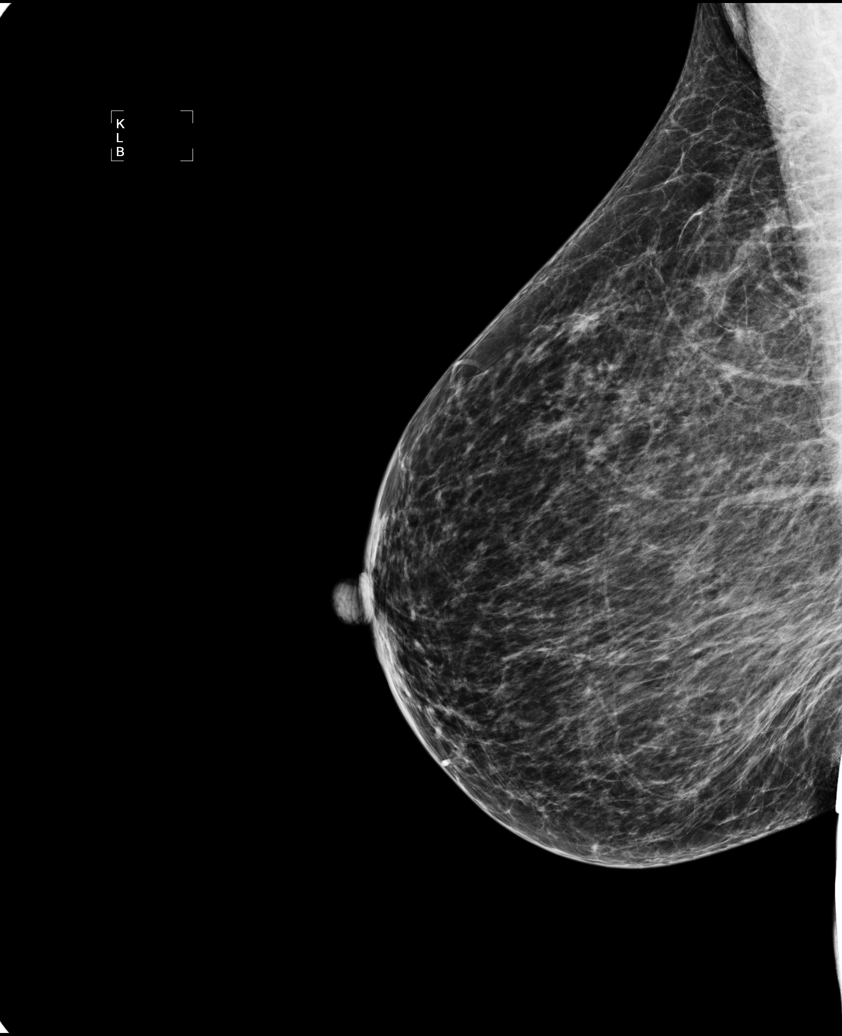

[L CC]
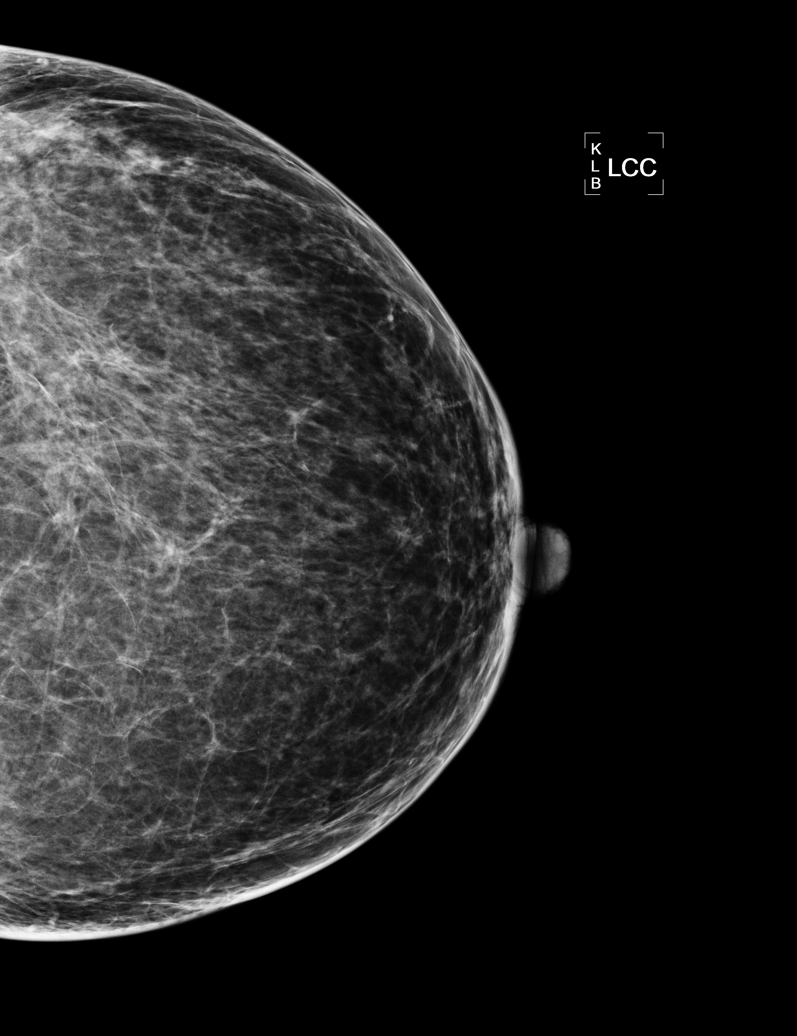

[L MLO (1 of 2)]
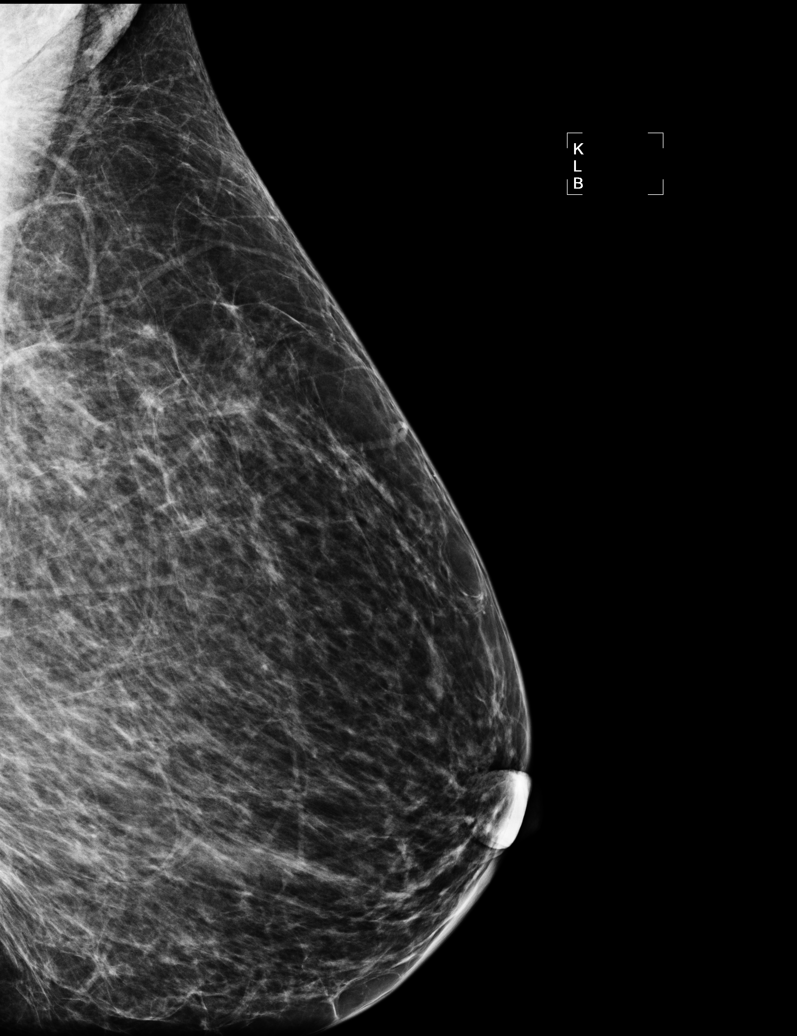

[L MLO (2 of 2)]
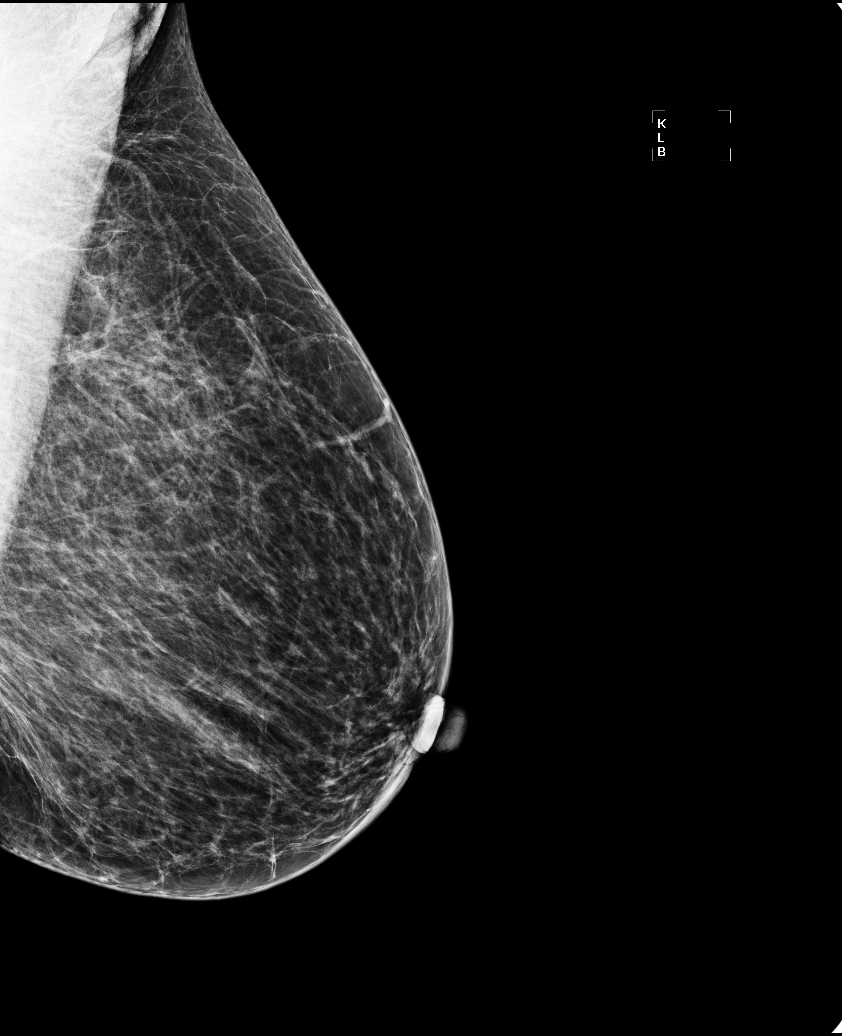

[5 of 5 positions shown; findings below may reference images not displayed]

ACR Breast Density Category c: The breast tissue is heterogeneously
dense, which may obscure small masses
FINDINGS: There are no findings suspicious for malignancy. Images were
processed with CAD.
IMPRESSION: No mammographic evidence of malignancy. A result letter of this
screening mammogram will be mailed directly to the patient.

RECOMMENDATION:
Screening mammogram in one year. (Code:[id number])

BI-RADS CATEGORY  1: Negative

## 2015-04-28 ENCOUNTER — Encounter: Payer: Self-pay | Admitting: Internal Medicine

## 2015-04-28 ENCOUNTER — Ambulatory Visit: Payer: Medicaid Other | Attending: Internal Medicine | Admitting: Internal Medicine

## 2015-04-28 VITALS — BP 135/81 | HR 79 | Temp 98.0°F | Resp 18 | Ht 64.0 in | Wt 163.6 lb

## 2015-04-28 DIAGNOSIS — E039 Hypothyroidism, unspecified: Secondary | ICD-10-CM | POA: Diagnosis not present

## 2015-04-28 DIAGNOSIS — E1165 Type 2 diabetes mellitus with hyperglycemia: Secondary | ICD-10-CM | POA: Insufficient documentation

## 2015-04-28 DIAGNOSIS — E1110 Type 2 diabetes mellitus with ketoacidosis without coma: Secondary | ICD-10-CM

## 2015-04-28 DIAGNOSIS — I159 Secondary hypertension, unspecified: Secondary | ICD-10-CM | POA: Diagnosis not present

## 2015-04-28 DIAGNOSIS — I10 Essential (primary) hypertension: Secondary | ICD-10-CM | POA: Diagnosis not present

## 2015-04-28 DIAGNOSIS — E1310 Other specified diabetes mellitus with ketoacidosis without coma: Secondary | ICD-10-CM | POA: Diagnosis not present

## 2015-04-28 DIAGNOSIS — Z79899 Other long term (current) drug therapy: Secondary | ICD-10-CM | POA: Diagnosis not present

## 2015-04-28 DIAGNOSIS — Z139 Encounter for screening, unspecified: Secondary | ICD-10-CM

## 2015-04-28 DIAGNOSIS — Z1239 Encounter for other screening for malignant neoplasm of breast: Secondary | ICD-10-CM

## 2015-04-28 DIAGNOSIS — E139 Other specified diabetes mellitus without complications: Secondary | ICD-10-CM

## 2015-04-28 DIAGNOSIS — E038 Other specified hypothyroidism: Secondary | ICD-10-CM

## 2015-04-28 DIAGNOSIS — E559 Vitamin D deficiency, unspecified: Secondary | ICD-10-CM

## 2015-04-28 DIAGNOSIS — E785 Hyperlipidemia, unspecified: Secondary | ICD-10-CM | POA: Diagnosis not present

## 2015-04-28 DIAGNOSIS — Z0000 Encounter for general adult medical examination without abnormal findings: Secondary | ICD-10-CM

## 2015-04-28 LAB — CBC WITH DIFFERENTIAL/PLATELET
Basophils Absolute: 46 cells/uL (ref 0–200)
Basophils Relative: 1 %
Eosinophils Absolute: 184 cells/uL (ref 15–500)
Eosinophils Relative: 4 %
HEMATOCRIT: 33.4 % — AB (ref 35.0–45.0)
Hemoglobin: 11 g/dL — ABNORMAL LOW (ref 11.7–15.5)
Lymphocytes Relative: 46 %
Lymphs Abs: 2116 cells/uL (ref 850–3900)
MCH: 33.7 pg — ABNORMAL HIGH (ref 27.0–33.0)
MCHC: 32.9 g/dL (ref 32.0–36.0)
MCV: 102.5 fL — ABNORMAL HIGH (ref 80.0–100.0)
MONO ABS: 322 {cells}/uL (ref 200–950)
MONOS PCT: 7 %
MPV: 10.7 fL (ref 7.5–12.5)
NEUTROS PCT: 42 %
Neutro Abs: 1932 cells/uL (ref 1500–7800)
Platelets: 246 10*3/uL (ref 140–400)
RBC: 3.26 MIL/uL — AB (ref 3.80–5.10)
RDW: 13.3 % (ref 11.0–15.0)
WBC: 4.6 10*3/uL (ref 3.8–10.8)

## 2015-04-28 LAB — LIPID PANEL
CHOL/HDL RATIO: 3.3 ratio (ref <=5.0)
Cholesterol: 190 mg/dL (ref 125–200)
HDL: 57 mg/dL (ref >=46)
LDL Cholesterol: 97 mg/dL (ref <130)
Triglycerides: 182 mg/dL — ABNORMAL HIGH (ref <150)
VLDL: 36 mg/dL — AB (ref <30)

## 2015-04-28 LAB — BASIC METABOLIC PANEL
BUN: 20 mg/dL (ref 7–25)
CO2: 24 mmol/L (ref 20–31)
Calcium: 8.8 mg/dL (ref 8.6–10.2)
Chloride: 102 mmol/L (ref 98–110)
Creat: 1.03 mg/dL (ref 0.50–1.10)
Glucose, Bld: 218 mg/dL — ABNORMAL HIGH (ref 65–99)
Potassium: 4.8 mmol/L (ref 3.5–5.3)
Sodium: 135 mmol/L (ref 135–146)

## 2015-04-28 LAB — POCT GLYCOSYLATED HEMOGLOBIN (HGB A1C): Hemoglobin A1C: 8.1

## 2015-04-28 LAB — TSH: TSH: 39.66 m[IU]/L — AB

## 2015-04-28 LAB — GLUCOSE, POCT (MANUAL RESULT ENTRY): POC Glucose: 236 mg/dl — AB (ref 70–99)

## 2015-04-28 MED ORDER — METFORMIN HCL ER 500 MG PO TB24: 1000.0000 mg | ORAL_TABLET | Freq: Two times a day (BID) | ORAL | Status: DC

## 2015-04-28 MED ORDER — HYDROCHLOROTHIAZIDE 12.5 MG PO TABS: 12.5000 mg | ORAL_TABLET | Freq: Every day | ORAL | Status: DC

## 2015-04-28 MED ORDER — LISINOPRIL 10 MG PO TABS: 10.0000 mg | ORAL_TABLET | Freq: Every day | ORAL | Status: DC

## 2015-04-28 MED ORDER — GLIPIZIDE 10 MG PO TABS: 10.0000 mg | ORAL_TABLET | Freq: Two times a day (BID) | ORAL | Status: DC

## 2015-04-28 MED ORDER — LEVOTHYROXINE SODIUM 200 MCG PO TABS: 200.0000 ug | ORAL_TABLET | Freq: Every day | ORAL | Status: DC

## 2015-04-28 MED FILL — ?LEVOTHYROXINE 200 MCG TAB: 200 | 30 days supply | Qty: 30 | Fill #0

## 2015-04-28 MED FILL — glipiZIDE 10 MG TABS: 10 | 30 days supply | Qty: 60 | Fill #0

## 2015-04-28 MED FILL — METFORMIN HCL ER 500 MG TAB: 500 | 30 days supply | Qty: 120 | Fill #0

## 2015-04-28 MED FILL — LISINOPRIL 10 MG TABLET: 10 | 30 days supply | Qty: 30 | Fill #0

## 2015-04-28 MED FILL — HYDROCHLOROTHIAZIDE 12.5 MG: 12.5 | 30 days supply | Qty: 30 | Fill #0

## 2015-04-28 NOTE — Patient Instructions (Signed)
- make appt for [facility] for [facility] and replacement of mirena IUD  - make appt at same time w/ [facility] to have dm checked.  DASH Eating Plan DASH stands for "Dietary Approaches to Stop Hypertension." The DASH eating plan is a healthy eating plan that has been shown to reduce high blood pressure (hypertension). Additional health benefits may include reducing the risk of type 2 diabetes mellitus, heart disease, and stroke. The DASH eating plan may also help with weight loss. WHAT DO I NEED TO KNOW ABOUT THE DASH EATING PLAN? For the DASH eating plan, you will follow these general guidelines:  Choose foods with a percent daily value for sodium of less than 5% (as listed on the food label).  Use salt-free seasonings or herbs instead of table salt or sea salt.  Check with your health care provider or pharmacist before using salt substitutes.  Eat lower-sodium products, often labeled as "lower sodium" or "no salt added."  Eat fresh foods.  Eat more vegetables, fruits, and low-fat dairy products.  Choose whole grains. Look for the word "whole" as the first word in the ingredient list.  Choose fish and skinless chicken or [place] more often than red meat. Limit fish, poultry, and meat to 6 oz (170 g) each day.  Limit sweets, desserts, sugars, and sugary drinks.  Choose heart-healthy fats.  Limit cheese to 1 oz (28 g) per day.  Eat more home-cooked food and less restaurant, buffet, and fast food.  Limit fried foods.  Cook foods using methods other than frying.  Limit canned vegetables. If you do use them, rinse them well to decrease the sodium.  When eating at a restaurant, ask that your food be prepared with less salt, or no salt if possible. WHAT FOODS CAN I EAT? Seek help from a dietitian for individual calorie needs. Grains Whole grain or whole wheat bread. Brown rice. Whole grain or whole wheat pasta. Quinoa, bulgur, and whole grain cereals. Low-sodium cereals.  Corn or whole wheat flour tortillas. Whole grain cornbread. Whole grain crackers. Low-sodium crackers. Vegetables Fresh or frozen vegetables (raw, steamed, roasted, or grilled). Low-sodium or reduced-sodium tomato and vegetable juices. Low-sodium or reduced-sodium tomato sauce and paste. Low-sodium or reduced-sodium canned vegetables.  Fruits All fresh, canned (in natural juice), or frozen fruits. Meat and Other Protein Products Ground beef (85% or leaner), grass-fed beef, or beef trimmed of fat. Skinless chicken or [place]. Ground chicken or [place]. Pork trimmed of fat. All fish and seafood. Eggs. Dried beans, peas, or lentils. Unsalted nuts and seeds. Unsalted canned beans. Dairy Low-fat dairy products, such as skim or 1% milk, 2% or reduced-fat cheeses, low-fat ricotta or cottage cheese, or plain low-fat yogurt. Low-sodium or reduced-sodium cheeses. Fats and Oils Tub margarines without trans fats. Light or reduced-fat mayonnaise and salad dressings (reduced sodium). Avocado. Safflower, olive, or canola oils. Natural peanut or almond butter. Other Unsalted popcorn and pretzels. The items listed above may not be a complete list of recommended foods or beverages. Contact your dietitian for more options. WHAT FOODS ARE NOT RECOMMENDED? Grains White bread. White pasta. White rice. Refined cornbread. Bagels and croissants. Crackers that contain trans fat. Vegetables Creamed or fried vegetables. Vegetables in a cheese sauce. Regular canned vegetables. Regular canned tomato sauce and paste. Regular tomato and vegetable juices. Fruits Dried fruits. Canned fruit in light or heavy syrup. Fruit juice. Meat and Other Protein Products Fatty cuts of meat. Ribs, chicken wings, bacon, sausage, bologna, salami, chitterlings, fatback, hot  dogs, bratwurst, and packaged luncheon meats. Salted nuts and seeds. Canned beans with salt. Dairy Whole or 2% milk, cream, half-and-half, and cream cheese. Whole-fat or  sweetened yogurt. Full-fat cheeses or blue cheese. Nondairy creamers and whipped toppings. Processed cheese, cheese spreads, or cheese curds. Condiments Onion and garlic salt, seasoned salt, table salt, and sea salt. Canned and packaged gravies. Worcestershire sauce. Tartar sauce. Barbecue sauce. Teriyaki sauce. Soy sauce, including reduced sodium. Steak sauce. Fish sauce. Oyster sauce. Cocktail sauce. Horseradish. Ketchup and mustard. Meat flavorings and tenderizers. Bouillon cubes. Hot sauce. Tabasco sauce. Marinades. Taco seasonings. Relishes. Fats and Oils Butter, stick margarine, lard, shortening, ghee, and bacon fat. Coconut, palm kernel, or palm oils. Regular salad dressings. Other Pickles and olives. Salted popcorn and pretzels. The items listed above may not be a complete list of foods and beverages to avoid. Contact your dietitian for more information. WHERE CAN I FIND MORE INFORMATION? National Heart, Lung, and Blood Institute: CablePromo.itwww.nhlbi.nih.gov/health/health-topics/topics/dash/   This information is not intended to replace advice given to you by your health care provider. Make sure you discuss any questions you have with your health care provider.   Document Released: 12/31/2010 Document Revised: 02/01/2014 Document Reviewed: 11/15/2012 Elsevier Interactive Patient Education 2016 [employer]   Diabetes Mellitus and Food It is important for you to manage your blood sugar (glucose) level. Your blood glucose level can be greatly affected by what you eat. Eating healthier foods in the appropriate amounts throughout the day at about the same time each day will help you control your blood glucose level. It can also help slow or prevent worsening of your diabetes mellitus. Healthy eating may even help you improve the level of your blood pressure and reach or maintain a healthy weight.  General recommendations for healthful eating and cooking habits include:  Eating meals and snacks  regularly. Avoid going long periods of time without eating to lose weight.  Eating a diet that consists mainly of plant-based foods, such as fruits, vegetables, nuts, legumes, and whole grains.  Using low-heat cooking methods, such as baking, instead of high-heat cooking methods, such as deep frying. Work with your dietitian to make sure you understand how to use the Nutrition Facts information on food labels. HOW CAN FOOD AFFECT ME? Carbohydrates Carbohydrates affect your blood glucose level more than any other type of food. Your dietitian will help you determine how many carbohydrates to eat at each meal and teach you how to count carbohydrates. Counting carbohydrates is important to keep your blood glucose at a healthy level, especially if you are using insulin or taking certain medicines for diabetes mellitus. Alcohol Alcohol can cause sudden decreases in blood glucose (hypoglycemia), especially if you use insulin or take certain medicines for diabetes mellitus. Hypoglycemia can be a life-threatening condition. Symptoms of hypoglycemia (sleepiness, dizziness, and disorientation) are similar to symptoms of having too much alcohol.  If your health care provider has given you approval to drink alcohol, do so in moderation and use the following guidelines:  Women should not have more than one drink per day, and men should not have more than two drinks per day. One drink is equal to:  12 oz of beer.  5 oz of wine.  1 oz of hard liquor.  Do not drink on an empty stomach.  Keep yourself hydrated. Have water, diet soda, or unsweetened iced tea.  Regular soda, juice, and other mixers might contain a lot of carbohydrates and should be counted. WHAT FOODS ARE NOT  RECOMMENDED? As you make food choices, it is important to remember that all foods are not the same. Some foods have fewer nutrients per serving than other foods, even though they might have the same number of calories or carbohydrates.  It is difficult to get your body what it needs when you eat foods with fewer nutrients. Examples of foods that you should avoid that are high in calories and carbohydrates but low in nutrients include:  Trans fats (most processed foods list trans fats on the Nutrition Facts label).  Regular soda.  Juice.  Candy.  Sweets, such as cake, pie, doughnuts, and cookies.  Fried foods. WHAT FOODS CAN I EAT? Eat nutrient-rich foods, which will nourish your body and keep you healthy. The food you should eat also will depend on several factors, including:  The calories you need.  The medicines you take.  Your weight.  Your blood glucose level.  Your blood pressure level.  Your cholesterol level. You should eat a variety of foods, including:  Protein.  Lean cuts of meat.  Proteins low in saturated fats, such as fish, egg whites, and beans. Avoid processed meats.  Fruits and vegetables.  Fruits and vegetables that may help control blood glucose levels, such as apples, mangoes, and yams.  Dairy products.  Choose fat-free or low-fat dairy products, such as milk, yogurt, and cheese.  Grains, bread, pasta, and rice.  Choose whole grain products, such as multigrain bread, whole oats, and brown rice. These foods may help control blood pressure.  Fats.  Foods containing healthful fats, such as nuts, avocado, olive oil, canola oil, and fish. DOES EVERYONE WITH DIABETES MELLITUS HAVE THE SAME MEAL PLAN? Because every person with diabetes mellitus is different, there is not one meal plan that works for everyone. It is very important that you meet with a dietitian who will help you create a meal plan that is just right for you.   This information is not intended to replace advice given to you by your health care provider. Make sure you discuss any questions you have with your health care provider.   Document Released: 10/08/2004 Document Revised: 02/01/2014 Document Reviewed:  12/08/2012 Elsevier Interactive Patient Education [employer]

## 2015-04-28 NOTE — Progress Notes (Signed)
Ariana Cunningham, is a 45 y.o. female  KVQ:[id number]  IEP:[id number]  DOB - 03-18-1970  CC:  Chief Complaint  Patient presents with  . Follow-up    HTN + DM       HPI: Ariana Cunningham is a 45 y.o. female here today to establish medical care.  She was last seen in our clinic 5/16, loss to f/u due to insurance and work.  Has been out of all her meds for at least 2-3 months.  Of note, she checks her glucose at times at home, runs 150-175.  Also tries to watch her carbs.  Hasnt had eye exam in while as well.   Had her Theador Hawthorne IUD placed Dec 2012, and was hoping to get it changed out today as well.  Patient has No headache, No chest pain, No abdominal pain - No Nausea, No new weakness tingling or numbness, No Cough - SOB.  No Known Allergies Past Medical History  Diagnosis Date  . Hyperlipidemia   . Diabetes mellitus   . Thyroid condition    Current Outpatient Prescriptions on File Prior to Visit  Medication Sig Dispense Refill  . Blood Glucose Monitoring Suppl (ACCU-CHEK AVIVA PLUS) W/DEVICE KIT 1 kit by Does not apply route 3 times daily with meals, bedtime and 2 AM. 1 kit 3  . fluticasone (FLONASE) 50 MCG/ACT nasal spray Place 2 sprays into both nostrils daily. 16 g 6  . glucose blood (ACCU-CHEK AVIVA) test strip Use as instructed 100 each 12  . glucose blood (RELION GLUCOSE TEST STRIPS) test strip Use as instructed 100 each 12  . glucose monitoring kit (FREESTYLE) monitoring kit 1 each by Does not apply route as needed for other (Any glucometer with q. a.c. at bedtime testing supplies). 1 each 3  . Lancets (ACCU-CHEK MULTICLIX) lancets Use as instructed 100 each 12  . Lancets Misc. (ACCU-CHEK MULTICLIX LANCET DEV) KIT 1 kit by Does not apply route 4 (four) times daily -  before meals and at bedtime. 1 each 12  . levothyroxine (SYNTHROID, LEVOTHROID) 200 MCG tablet Take 1 tablet (200 mcg total) by mouth daily. 30 tablet 5  . levothyroxine (SYNTHROID, LEVOTHROID) 25 MCG tablet Take  1 tablet (25 mcg total) by mouth daily before breakfast. 30 tablet 4  . Vitamin D, Ergocalciferol, (DRISDOL) 50000 UNITS CAPS capsule Take 1 capsule (50,000 Units total) by mouth every 7 (seven) days. (Patient not taking: Reported on 05/29/2014) 12 capsule 0   No current facility-administered medications on file prior to visit.   History reviewed. No pertinent family history. Social History   Social History  . Marital Status: Single    Spouse Name: N/A  . Number of Children: N/A  . Years of Education: N/A   Occupational History  . Not on file.   Social History Main Topics  . Smoking status: Never Smoker   . Smokeless tobacco: Never Used  . Alcohol Use: No  . Drug Use: No  . Sexual Activity:    Partners: Male    Birth Control/ Protection: IUD   Other Topics Concern  . Not on file   Social History Narrative    Review of Systems: Constitutional: Negative for fever, chills, diaphoresis, activity change, appetite change and fatigue. HENT: Negative for ear pain, nosebleeds, congestion, facial swelling, rhinorrhea, neck pain, neck stiffness and ear discharge.  Eyes: Negative for pain, discharge, redness, itching and visual disturbance. Respiratory: Negative for cough, choking, chest tightness, shortness of breath, wheezing and stridor.  Cardiovascular: Negative  for chest pain, palpitations and leg swelling. Gastrointestinal: Negative for abdominal distention. Genitourinary: Negative for dysuria, urgency, frequency, hematuria, flank pain, decreased urine volume, difficulty urinating and dyspareunia.  Musculoskeletal: Negative for back pain, joint swelling, arthralgia and gait problem. Neurological: Negative for dizziness, tremors, seizures, syncope, facial asymmetry, speech difficulty, weakness, light-headedness, numbness and headaches.  Hematological: Negative for adenopathy. Does not bruise/bleed easily. Psychiatric/Behavioral: Negative for hallucinations, behavioral problems,  confusion, dysphoric mood, decreased concentration and agitation.    Objective:   Filed Vitals:   04/28/15 1036  BP: 135/81  Pulse: 79  Temp: 98 F (36.7 C)  Resp: 18    Physical Exam: Constitutional: Patient appears well-developed and well-nourished. No distress.  Pleasant, aaox 3 HENT: Normocephalic, atraumatic, External right and left ear normal. Oropharynx is clear and moist.  Eyes: Conjunctivae and EOM are normal. PERRL, no scleral icterus. Neck: Normal ROM. Neck supple. No JVD. No tracheal deviation. CVS: RRR, S1/S2 +, no murmurs, no gallops, no carotid bruit.  Pulmonary: Effort and breath sounds normal, no stridor, rhonchi, wheezes, rales.  Abdominal: Soft. BS +, no distension, tenderness, rebound or guarding.  Musculoskeletal: Normal range of motion. No edema and no tenderness.  LE: bilateral foot exam, edema on LLE 1+, rle no edema (prior trauma abt 15 years ago per pt to feet), monofilament test bilat nml to sensation, pulses 2+ dorsalis pedis / posterior tibia.  No active lesions/ulcers noted. Lymphadenopathy: No lymphadenopathy noted, cervical Neuro: Alert. Normal reflexes, muscle tone coordination. No cranial nerve deficit grossly. Skin: Skin is warm and dry. No rash noted. Not diaphoretic. No erythema. No pallor. Psychiatric: Normal mood and affect. Behavior, judgment, thought content normal.  Lab Results  Component Value Date   WBC 5.8 05/04/2013   HGB 11.2* 05/04/2013   HCT 32.5* 05/04/2013   MCV 97.0 05/04/2013   PLT 259 05/04/2013   Lab Results  Component Value Date   CREATININE 1.10 06/05/2014   BUN 17 06/05/2014   NA 136 06/05/2014   K 4.2 06/05/2014   CL 102 06/05/2014   CO2 26 06/05/2014    Lab Results  Component Value Date   HGBA1C 8.1 04/28/2015   Lipid Panel     Component Value Date/Time   CHOL 185 05/04/2013 1601   TRIG 97 05/04/2013 1601   HDL 58 05/04/2013 1601   CHOLHDL 3.2 05/04/2013 1601   VLDL 19 05/04/2013 1601   LDLCALC 108*  05/04/2013 1601       Assessment and plan:   1. Uncontrolled type 2 diabetes mellitus with ketoacidosis without coma, without long-term current use of insulin (HCC) - POCT A1C 8.1 - Glucose (CBG) 236 - Microalbumin/Creatinine Ratio, Urine pending - Lipid Panel ordered -renewed glipiZIDE (GLUCOTROL) 10 MG tablet; Take 1 tablet (10 mg total) by mouth 2 (two) times daily before a meal.  Dispense: 60 tablet; Refill: 3 -renewed metFORMIN (GLUCOPHAGE XR) 500 MG 24 hr tablet; Take 2 tablets (1,000 mg total) by mouth 2 (two) times daily with a meal.  Dispense: 120 tablet; Refill: 4 - ADA diet/dash diet dw pt. - ordered annual screening eye exam w opth. - fu w/ [facility] when she is here for the Mirena replacement to chk glucose as well.   2. HTN (hypertension), benign - actually decently controlled -  lisinopril (PRINIVIL,ZESTRIL) 10 MG tablet; Take 1 tablet (10 mg total) by mouth daily.  Dispense: 30 tablet; Refill: 3 - renewed hctz 12.5 (le edema noted on LLE)  3. Hypothyroidism, unspecified hypothyroidism type -  will rechk tsh,  - Renewed synthroid 267mg for now, prior was on 225, but doesn't appear to be taking anything for months.   4. Health care maintenance, - Basic Metabolic Panel - CBC with Differential - TSH   5.  hx of Vit D deficiency in past, currently not taking supplements - chk vit D  6. Screening for breast ca - Per pt, had screening MM about 2 years ago , negative at time, no family hx of breast cancer as far as she knows., currently on Mirena IUD - will order screening MM  7. Birth control - due for mirena IUD change, pt will [facility] up [facility]/ [facility].   Return in about 3 months (around 07/28/2015).  The patient was given clear instructions to go to ER or return to medical center if symptoms don't improve, worsen or new problems develop. The patient verbalized understanding. The patient was told to call to get lab results if  they haven't heard anything in the next week.    dw Dr FAdrian Blackwater appreciate her assistance.  DMaren Reamer MD, [place]  04/28/2015, 10:53 AM

## 2015-04-28 NOTE — Progress Notes (Signed)
Patient is here for FU DM + HTN  Patient denies pain at this time.  Patient is going to reschedule for mirena removal and insertion.

## 2015-04-29 ENCOUNTER — Other Ambulatory Visit: Payer: Self-pay | Admitting: Internal Medicine

## 2015-04-29 ENCOUNTER — Telehealth: Payer: Self-pay | Admitting: *Deleted

## 2015-04-29 DIAGNOSIS — E559 Vitamin D deficiency, unspecified: Secondary | ICD-10-CM

## 2015-04-29 LAB — MICROALBUMIN / CREATININE URINE RATIO
CREATININE, URINE: 133 mg/dL (ref 20–320)
Microalb Creat Ratio: 105 mcg/mg creat — ABNORMAL HIGH (ref <30)
Microalb, Ur: 14 mg/dL

## 2015-04-29 LAB — VITAMIN D 25 HYDROXY (VIT D DEFICIENCY, FRACTURES): VIT D 25 HYDROXY: 6 ng/mL — AB (ref 30–100)

## 2015-04-29 MED ORDER — LEVOTHYROXINE SODIUM 50 MCG PO TABS: 50.0000 ug | ORAL_TABLET | Freq: Every day | ORAL | Status: DC

## 2015-04-29 MED ORDER — VITAMIN D (ERGOCALCIFEROL) 1.25 MG (50000 UNIT) PO CAPS: 50000.0000 [IU] | ORAL_CAPSULE | ORAL | Status: DC

## 2015-04-29 MED FILL — VIT D2 1.25 MG (50,000 UNIT: 1.25 MG | 84 days supply | Qty: 12 | Fill #0

## 2015-04-29 MED FILL — LEVOTHYROXINE 50 MCG TABLET: 50 | 30 days supply | Qty: 30 | Fill #0

## 2015-04-29 NOTE — Telephone Encounter (Signed)
Patient verified DOB Patient is aware of Vitamin D level being low. Patient advised to [facility] and begin taking immediately. Patient made aware of severe hypothyroidism being present as well as anemia. Patient instructed to take a total of 250 mcg of medication for thyroid. Patient informed of medical illnesses contributing to chronic anemia. Patient advised to take medications as prescribed and have her levels rechecked at 3 month FU visit. Patient expressed her understanding and had no further questions at this time.

## 2015-04-29 NOTE — Telephone Encounter (Signed)
-----   Message from Pete Glatterawn T Langeland, MD sent at 04/29/2015  8:42 AM EDT ----- Please call pt and tell her Vit D levels was extremely low, which can contribute to bone pain/muscle pain, and has cardiovascular risk.  I put in prescription for Vit D replacement. Also her thyroid levels are extremely abnormal, severe hypothyroidism, which has many risk problems.  Yesterday I renewed 200mcg, but added 50mcg today prescription, to total 250mcg daily.  She has chronic anemia as well, but suspect her other medical illness contributing.  Will rechk all levels in 3 months on her fu once she starts taking the meds again. Thanks.

## 2015-05-08 ENCOUNTER — Ambulatory Visit: Payer: BLUE CROSS/BLUE SHIELD | Admitting: Pharmacist

## 2015-05-08 ENCOUNTER — Ambulatory Visit: Payer: Medicaid Other | Attending: Family Medicine | Admitting: Family Medicine

## 2015-05-08 ENCOUNTER — Encounter: Payer: Self-pay | Admitting: Family Medicine

## 2015-05-08 VITALS — BP 100/66 | HR 79 | Temp 98.0°F | Resp 20 | Ht 65.0 in | Wt 158.0 lb

## 2015-05-08 DIAGNOSIS — E1310 Other specified diabetes mellitus with ketoacidosis without coma: Secondary | ICD-10-CM

## 2015-05-08 DIAGNOSIS — Z30433 Encounter for removal and reinsertion of intrauterine contraceptive device: Secondary | ICD-10-CM | POA: Diagnosis not present

## 2015-05-08 DIAGNOSIS — E1110 Type 2 diabetes mellitus with ketoacidosis without coma: Secondary | ICD-10-CM

## 2015-05-08 LAB — GLUCOSE, POCT (MANUAL RESULT ENTRY): POC GLUCOSE: 163 mg/dL — AB (ref 70–99)

## 2015-05-08 MED ORDER — IBUPROFEN 200 MG PO TABS
600.0000 mg | ORAL_TABLET | Freq: Once | ORAL | Status: AC
  Administered 2015-05-08: 600 mg via ORAL

## 2015-05-08 NOTE — Assessment & Plan Note (Signed)
IUD removed and re-inserted today Use back up birth control. Condoms, for one week.  F/u in 2 weeks for IUD string check and trim

## 2015-05-08 NOTE — Patient Instructions (Signed)
IUD removed and re-inserted today Use back up birth control. Condoms, for one week.   Keep follow up with Dr. Julien NordmannLangeland for diabetes.  F/u with me in 2 weeks for IUD string check and trim.  Dr. [facility]

## 2015-05-08 NOTE — Progress Notes (Signed)
Filed Vitals:   05/08/15 1131  BP: 100/66  Pulse: 79  Temp: 98 F (36.7 C)  Resp: 20   CBG 163 IUD Insertion Procedure Note Diabetes patient presents for IUD removal and replacement.   Pre-operative Diagnosis: IUD remove and replace   Post-operative Diagnosis: same  Indications: contraception  Procedure Details  Urine pregnancy test was not done.  The risks (including infection, bleeding, pain, and uterine perforation) and benefits of the procedure were explained to the patient and Written informed consent was obtained.    IUD strings visualized and current IUD removed with ring forceps.   Cervix cleansed with Betadine. Uterus sounded to 8 cm. IUD inserted without difficulty. String visible and trimmed. Strings left long as iris scissors were accidentally dropped and another pair was not available strings trimmed to 10 cm. Patient tolerated procedure well.  IUD Information: Mirena, Lot # [id number], Expiration date 11/2016.  Condition: Stable  Complications: None  Plan:  The patient was advised to call for any fever or for prolonged or severe pain or bleeding. She was advised to use OTC analgesics and OTC ibuprofen as needed for mild to moderate pain.

## 2015-05-22 ENCOUNTER — Encounter: Payer: Self-pay | Admitting: Family Medicine

## 2015-05-22 ENCOUNTER — Ambulatory Visit: Payer: Medicaid Other | Attending: Family Medicine | Admitting: Family Medicine

## 2015-05-22 VITALS — BP 133/78 | HR 88 | Temp 98.7°F | Resp 16 | Ht 64.0 in | Wt 155.0 lb

## 2015-05-22 DIAGNOSIS — Z7984 Long term (current) use of oral hypoglycemic drugs: Secondary | ICD-10-CM | POA: Insufficient documentation

## 2015-05-22 DIAGNOSIS — Z79899 Other long term (current) drug therapy: Secondary | ICD-10-CM | POA: Diagnosis not present

## 2015-05-22 DIAGNOSIS — Z30431 Encounter for routine checking of intrauterine contraceptive device: Secondary | ICD-10-CM | POA: Insufficient documentation

## 2015-05-22 DIAGNOSIS — E1110 Type 2 diabetes mellitus with ketoacidosis without coma: Secondary | ICD-10-CM

## 2015-05-22 DIAGNOSIS — E1310 Other specified diabetes mellitus with ketoacidosis without coma: Secondary | ICD-10-CM

## 2015-05-22 DIAGNOSIS — Z975 Presence of (intrauterine) contraceptive device: Secondary | ICD-10-CM

## 2015-05-22 MED ORDER — TRUE METRIX METER W/DEVICE KIT: 1.0000 | PACK | Status: DC | PRN

## 2015-05-22 MED ORDER — TRUEPLUS LANCETS 28G MISC: 1.0000 | Freq: Three times a day (TID) | Status: DC

## 2015-05-22 MED ORDER — GLUCOSE BLOOD VI STRP: 1.0000 | ORAL_STRIP | Freq: Three times a day (TID) | Status: DC

## 2015-05-22 NOTE — Progress Notes (Signed)
F/U IUD  Stated no problems with IUD, no pain with intercourse  No tobacco user  No suicidal thoughts in the past two weeks

## 2015-05-22 NOTE — Progress Notes (Signed)
Subjective:  Patient ID: Ariana Cunningham, female    DOB: [birth year]  Age: 45 y.o. MRN: [id number]  CC: Contraception   HPI Ariana Cunningham presents for   1. IUD string check: no abdominal pain. No pain with intercourse. No vaginal bleeding.   Social History  Substance Use Topics  . Smoking status: Never Smoker   . Smokeless tobacco: Never Used  . Alcohol Use: No    Outpatient Prescriptions Prior to Visit  Medication Sig Dispense Refill  . Blood Glucose Monitoring Suppl (ACCU-CHEK AVIVA PLUS) W/DEVICE KIT 1 kit by Does not apply route 3 times daily with meals, bedtime and 2 AM. 1 kit 3  . fluticasone (FLONASE) 50 MCG/ACT nasal spray Place 2 sprays into both nostrils daily. 16 g 6  . glipiZIDE (GLUCOTROL) 10 MG tablet Take 1 tablet (10 mg total) by mouth 2 (two) times daily before a meal. 60 tablet 3  . glucose blood (ACCU-CHEK AVIVA) test strip Use as instructed 100 each 12  . glucose blood (RELION GLUCOSE TEST STRIPS) test strip Use as instructed 100 each 12  . glucose monitoring kit (FREESTYLE) monitoring kit 1 each by Does not apply route as needed for other (Any glucometer with q. a.c. at bedtime testing supplies). 1 each 3  . hydrochlorothiazide (HYDRODIURIL) 12.5 MG tablet Take 1 tablet (12.5 mg total) by mouth daily. 30 tablet 3  . Lancets (ACCU-CHEK MULTICLIX) lancets Use as instructed 100 each 12  . Lancets Misc. (ACCU-CHEK MULTICLIX LANCET DEV) KIT 1 kit by Does not apply route 4 (four) times daily -  before meals and at bedtime. 1 each 12  . levothyroxine (SYNTHROID, LEVOTHROID) 200 MCG tablet Take 1 tablet (200 mcg total) by mouth daily. 30 tablet 5  . levothyroxine (SYNTHROID, LEVOTHROID) 50 MCG tablet Take 1 tablet (50 mcg total) by mouth daily. 90 tablet 3  . lisinopril (PRINIVIL,ZESTRIL) 10 MG tablet Take 1 tablet (10 mg total) by mouth daily. 30 tablet 3  . metFORMIN (GLUCOPHAGE XR) 500 MG 24 hr tablet Take 2 tablets (1,000 mg total) by mouth 2 (two) times daily  with a meal. 120 tablet 4  . Vitamin D, Ergocalciferol, (DRISDOL) 50000 units CAPS capsule Take 1 capsule (50,000 Units total) by mouth every 7 (seven) days. 12 capsule 0   No facility-administered medications prior to visit.    ROS Review of Systems  Constitutional: Negative for fever and chills.  Gastrointestinal: Negative for abdominal pain.  Genitourinary: Negative for vaginal bleeding and dyspareunia.    Objective:  BP 133/78 mmHg  Pulse 88  Temp(Src) 98.7 F (37.1 C) (Oral)  Resp 16  Ht _0  (1.626 m)  Wt 155 lb (70.308 kg)  BMI 26.59 kg/m2  SpO2 98%  BP/Weight 05/22/2015 8/65/7846 10/01/2950  Systolic BP 841 324 401  Diastolic BP 78 66 81  Wt. (Lbs) 155 158 163.6  BMI 26.59 26.29 28.07    Physical Exam  Constitutional: She appears well-developed and well-nourished. No distress.  Cardiovascular: Normal rate, regular rhythm, normal heart sounds and intact distal pulses.   Pulmonary/Chest: Effort normal and breath sounds normal.  Genitourinary: Vagina normal and uterus normal. Pelvic exam was performed with patient prone. There is no rash, tenderness or lesion on the right labia. There is no rash, tenderness or lesion on the left labia. Cervix exhibits no motion tenderness, no discharge and no friability.  IUD strings visualized and trimmed to 4 cm  Musculoskeletal: She exhibits no edema.  Lymphadenopathy:  Right: No inguinal adenopathy present.       Left: No inguinal adenopathy present.  Skin: Skin is warm and dry. No rash noted.    Lab Results  Component Value Date   HGBA1C 8.1 04/28/2015    Assessment & Plan:   There are no diagnoses linked to this encounter.  No orders of the defined types were placed in this encounter.    Follow-up: No Follow-up on file.   Boykin Nearing MD

## 2015-05-22 NOTE — Patient Instructions (Addendum)
Venessa was seen today for contraception.  Diagnoses and all orders for this visit:  Uncontrolled type 2 diabetes mellitus with ketoacidosis without coma, without long-term current use of insulin ([facility]) -     Blood Glucose Monitoring Suppl (TRUE METRIX METER) w/Device KIT; 1 each by Does not apply route as needed. -     glucose blood (TRUE METRIX BLOOD GLUCOSE TEST) test strip; 1 each by Other route 3 (three) times daily. -     TRUEPLUS LANCETS 28G MISC; 1 each by Does not apply route 3 (three) times daily.   IUD strings in placed and trimmed today  Meter ordered Diabetes blood sugar goals  Fasting (in AM before breakfast, 8 hrs of no eating or drinking (except water or unsweetened coffee or tea): 90-110 2 hrs after meals: < 160,   No low sugars: nothing < 70    F/u with Dr. Janne Napoleon for diabetes in 4-6 weeks bring your meter and log of sugars   Dr. Adrian Blackwater

## 2015-05-22 NOTE — Assessment & Plan Note (Signed)
IUD check done Strings in place and trimmed to 4 cm

## 2015-12-02 ENCOUNTER — Ambulatory Visit: Payer: Self-pay | Attending: Internal Medicine | Admitting: Internal Medicine

## 2015-12-02 ENCOUNTER — Encounter: Payer: Self-pay | Admitting: Internal Medicine

## 2015-12-02 VITALS — BP 133/85 | HR 73 | Temp 98.5°F | Resp 16 | Wt 172.6 lb

## 2015-12-02 DIAGNOSIS — I10 Essential (primary) hypertension: Secondary | ICD-10-CM | POA: Insufficient documentation

## 2015-12-02 DIAGNOSIS — Z79899 Other long term (current) drug therapy: Secondary | ICD-10-CM | POA: Insufficient documentation

## 2015-12-02 DIAGNOSIS — E1310 Other specified diabetes mellitus with ketoacidosis without coma: Secondary | ICD-10-CM

## 2015-12-02 DIAGNOSIS — Z23 Encounter for immunization: Secondary | ICD-10-CM | POA: Insufficient documentation

## 2015-12-02 DIAGNOSIS — E785 Hyperlipidemia, unspecified: Secondary | ICD-10-CM | POA: Insufficient documentation

## 2015-12-02 DIAGNOSIS — E039 Hypothyroidism, unspecified: Secondary | ICD-10-CM | POA: Insufficient documentation

## 2015-12-02 DIAGNOSIS — E038 Other specified hypothyroidism: Secondary | ICD-10-CM

## 2015-12-02 DIAGNOSIS — E1110 Type 2 diabetes mellitus with ketoacidosis without coma: Secondary | ICD-10-CM | POA: Insufficient documentation

## 2015-12-02 DIAGNOSIS — Z794 Long term (current) use of insulin: Secondary | ICD-10-CM | POA: Insufficient documentation

## 2015-12-02 LAB — POCT URINALYSIS DIPSTICK
BILIRUBIN UA: NEGATIVE
GLUCOSE UA: 500
Ketones, UA: NEGATIVE
Leukocytes, UA: NEGATIVE
NITRITE UA: NEGATIVE
Protein, UA: 30
RBC UA: NEGATIVE
Spec Grav, UA: 1.015
UROBILINOGEN UA: 0.2
pH, UA: 6

## 2015-12-02 LAB — BASIC METABOLIC PANEL WITH GFR
BUN: 16 mg/dL (ref 7–25)
CHLORIDE: 103 mmol/L (ref 98–110)
CO2: 24 mmol/L (ref 20–31)
CREATININE: 1.07 mg/dL (ref 0.50–1.10)
Calcium: 9.5 mg/dL (ref 8.6–10.2)
GFR, Est African American: 73 mL/min (ref >=60)
GFR, Est Non African American: 64 mL/min (ref >=60)
GLUCOSE: 286 mg/dL — AB (ref 65–99)
Potassium: 4.1 mmol/L (ref 3.5–5.3)
Sodium: 136 mmol/L (ref 135–146)

## 2015-12-02 LAB — GLUCOSE, POCT (MANUAL RESULT ENTRY)
POC GLUCOSE: 282 mg/dL — AB (ref 70–99)
POC GLUCOSE: 356 mg/dL — AB (ref 70–99)

## 2015-12-02 LAB — POCT GLYCOSYLATED HEMOGLOBIN (HGB A1C): Hemoglobin A1C: 11.1

## 2015-12-02 MED ORDER — TRUE METRIX METER W/DEVICE KIT: 1.0000 | PACK | 0 refills | Status: AC | PRN

## 2015-12-02 MED ORDER — PRAVASTATIN SODIUM 20 MG PO TABS: 20.0000 mg | ORAL_TABLET | Freq: Every day | ORAL | 3 refills | Status: DC

## 2015-12-02 MED ORDER — LISINOPRIL 10 MG PO TABS: 10.0000 mg | ORAL_TABLET | Freq: Every day | ORAL | 3 refills | Status: DC

## 2015-12-02 MED ORDER — TRUEPLUS LANCETS 28G MISC: 1.0000 | Freq: Three times a day (TID) | 11 refills | Status: DC

## 2015-12-02 MED ORDER — INSULIN GLARGINE 100 UNIT/ML SOLOSTAR PEN: 20.0000 [IU] | PEN_INJECTOR | Freq: Every day | SUBCUTANEOUS | 3 refills | Status: DC

## 2015-12-02 MED ORDER — GLIPIZIDE 10 MG PO TABS
10.0000 mg | ORAL_TABLET | Freq: Two times a day (BID) | ORAL | 3 refills | Status: DC
Start: 2015-12-02 — End: 2016-01-13

## 2015-12-02 MED ORDER — GLUCOSE BLOOD VI STRP: 1.0000 | ORAL_STRIP | Freq: Three times a day (TID) | 11 refills | Status: DC

## 2015-12-02 MED ORDER — HYDROCHLOROTHIAZIDE 12.5 MG PO TABS: 12.5000 mg | ORAL_TABLET | Freq: Every day | ORAL | 3 refills | Status: DC

## 2015-12-02 MED ORDER — METFORMIN HCL ER 500 MG PO TB24: 1000.0000 mg | ORAL_TABLET | Freq: Two times a day (BID) | ORAL | 4 refills | Status: DC

## 2015-12-02 MED ORDER — INSULIN ASPART 100 UNIT/ML ~~LOC~~ SOLN
20.0000 [IU] | Freq: Once | SUBCUTANEOUS | Status: AC
  Administered 2015-12-02: 20 [IU] via SUBCUTANEOUS

## 2015-12-02 MED FILL — !LANTUS SOLOSTAR 100UNITS/M: 100 | 30 days supply | Qty: 6 | Fill #0

## 2015-12-02 MED FILL — TRUEplus LANCETS 28G MISC: 30 days supply | Qty: 100 | Fill #0

## 2015-12-02 MED FILL — ?HYDROCHLOROTHIAZIDE 12.5 M: 12.5 | 30 days supply | Qty: 30 | Fill #0

## 2015-12-02 MED FILL — TRUE METRIX TEST STRIP: 30 days supply | Qty: 100 | Fill #0

## 2015-12-02 MED FILL — PRAVASTATIN NA 20 MG TAB: 20 | 30 days supply | Qty: 30 | Fill #0

## 2015-12-02 MED FILL — ?LISINOPRIL 10 MG TABLET: 10 | 30 days supply | Qty: 30 | Fill #0

## 2015-12-02 MED FILL — TRUE METRIX BLOOD GLUCOSE M: W/DEVICE | 365 days supply | Qty: 1 | Fill #0

## 2015-12-02 MED FILL — METFORMIN HCL ER 500 MG TAB: 500 | 30 days supply | Qty: 120 | Fill #0

## 2015-12-02 MED FILL — ?GLIPIZIDE 10 MG TABLET: 10 | 30 days supply | Qty: 60 | Fill #0

## 2015-12-02 NOTE — Progress Notes (Signed)
Ariana Cunningham, is a 45 y.o. female  XLK:[id number]  [id number]:[id number]  DOB - 1970/03/14  Chief Complaint  Patient presents with  . Urinary Tract Infection  . Medication Refill        Subjective:   Ariana Cunningham is a 45 y.o. female here today for a follow up visit, last seen 05/08/15, for dm2, has not f/u w/ [place] due to many family issues. She states her nephew recently passed away due to [place].  She is otherwise doing well, taking all her meds as prescribed, but ran out of most meds recently.  Does not smoke or drink etoh.  Patient has No headache, No chest pain, No abdominal pain - No Nausea, No new weakness tingling or numbness, No Cough - SOB.  No problems updated.  ALLERGIES: No Known Allergies  PAST MEDICAL HISTORY: Past Medical History:  Diagnosis Date  . Diabetes mellitus   . Hyperlipidemia   . Thyroid condition     MEDICATIONS AT HOME: Prior to Admission medications   Medication Sig Start Date End Date Taking? Authorizing Provider  glipiZIDE (GLUCOTROL) 10 MG tablet Take 1 tablet (10 mg total) by mouth 2 (two) times daily before a meal. 12/02/15  Yes Maren Reamer, MD  hydrochlorothiazide (HYDRODIURIL) 12.5 MG tablet Take 1 tablet (12.5 mg total) by mouth daily. 12/02/15  Yes Dawn Lazarus Gowda, MD  levothyroxine (SYNTHROID, LEVOTHROID) 200 MCG tablet Take 1 tablet (200 mcg total) by mouth daily. 04/28/15  Yes Dawn Lazarus Gowda, MD  levothyroxine (SYNTHROID, LEVOTHROID) 50 MCG tablet Take 1 tablet (50 mcg total) by mouth daily. 04/29/15  Yes Maren Reamer, MD  lisinopril (PRINIVIL,ZESTRIL) 10 MG tablet Take 1 tablet (10 mg total) by mouth daily. 12/02/15  Yes Maren Reamer, MD  metFORMIN (GLUCOPHAGE XR) 500 MG 24 hr tablet Take 2 tablets (1,000 mg total) by mouth 2 (two) times daily with a meal. 12/02/15  Yes Maren Reamer, MD  Blood Glucose Monitoring Suppl (TRUE METRIX METER) w/Device KIT 1 each by Does not apply route as needed. 12/02/15   Maren Reamer, MD    fluticasone (FLONASE) 50 MCG/ACT nasal spray Place 2 sprays into both nostrils daily. Patient not taking: Reported on 12/02/2015 07/26/13   Lorayne Marek, MD  glucose blood (TRUE METRIX BLOOD GLUCOSE TEST) test strip 1 each by Other route 3 (three) times daily. 12/02/15   Maren Reamer, MD  Insulin Glargine (LANTUS SOLOSTAR) 100 UNIT/ML Solostar Pen Inject 20 Units into the skin daily at 10 pm. 12/02/15   Maren Reamer, MD  Lancets Misc. (ACCU-CHEK MULTICLIX LANCET DEV) KIT 1 kit by Does not apply route 4 (four) times daily -  before meals and at bedtime. Patient not taking: Reported on 12/02/2015 11/23/12   Thurnell Lose, MD  pravastatin (PRAVACHOL) 20 MG tablet Take 1 tablet (20 mg total) by mouth daily. 12/02/15   Maren Reamer, MD  TRUEPLUS LANCETS 28G MISC 1 each by Does not apply route 3 (three) times daily. 12/02/15   Maren Reamer, MD  Vitamin D, Ergocalciferol, (DRISDOL) 50000 units CAPS capsule Take 1 capsule (50,000 Units total) by mouth every 7 (seven) days. Patient not taking: Reported on 12/02/2015 04/29/15   Maren Reamer, MD     Objective:   Vitals:   12/02/15 0936  BP: 133/85  Pulse: 73  Resp: 16  Temp: 98.5 F (36.9 C)  TempSrc: Oral  SpO2: 97%  Weight: 172 lb 9.6 oz (78.3 kg)  Exam General appearance : Awake, alert, not in any distress. Speech Clear. Not toxic looking, pleasant. HEENT: Atraumatic and Normocephalic, pupils equally reactive to light. Neck: supple, no JVD.  Chest:Good air entry bilaterally, no added sounds. CVS: S1 S2 regular, no murmurs/gallups or rubs. Abdomen: Bowel sounds active, Non tender and not distended with no gaurding, rigidity or rebound. Extremities: B/L Lower Ext shows no edema, both legs are warm to touch Neurology: Awake alert, and oriented X 3, CN II-XII grossly intact, Non focal Skin:No Rash  Data Review Lab Results  Component Value Date   HGBA1C 11.1 12/02/2015   HGBA1C 8.1 04/28/2015   HGBA1C 8.3 05/29/2014     Depression screen PHQ 2/9 12/02/2015 05/22/2015 04/28/2015 05/04/2013  Decreased Interest 0 0 0 0  Down, Depressed, Hopeless 0 0 0 0  PHQ - 2 Score 0 0 0 0      Assessment & Plan   1. Uncontrolled type 2 diabetes mellitus with ketoacidosis without coma, without long-term current use of insulin ([place]) Ooc, recd starting basal insulin today, renewed rx, better diet/more exercise - pick up glucometer, chk cbg at least 3x day for now, and bring in her #s to f/u clinic w/ Erline Levine, [place] [facility] in 2 wks, may need bolus insulin regimen as well if no improvement. - appreciate Erline Levine, pharmacist assistance in lantus instructions today. - POCT glucose (manual entry) 356 - POCT glycosylated hemoglobin (Hb A1C) 11.1 - Urinalysis Dipstick - no uti, no ketones - insulin aspart (novoLOG) injection 20 Units; Inject 0.2 mLs (20 Units total) into the skin once. - TRUEPLUS LANCETS 28G MISC; 1 each by Does not apply route 3 (three) times daily.  Dispense: 100 each; Refill: 11 - glucose blood (TRUE METRIX BLOOD GLUCOSE TEST) test strip; 1 each by Other route 3 (three) times daily.  Dispense: 100 each; Refill: 11 - Blood Glucose Monitoring Suppl (TRUE METRIX METER) w/Device KIT; 1 each by Does not apply route as needed.  Dispense: 1 kit; Refill: 0 - glipiZIDE (GLUCOTROL) 10 MG tablet; Take 1 tablet (10 mg total) by mouth 2 (two) times daily before a meal.  Dispense: 60 tablet; Refill: 3 - Microalbumin/Creatinine Ratio, Urine - BASIC METABOLIC PANEL WITH GFR - POCT glucose (manual entry) - Insulin Glargine (LANTUS SOLOSTAR) 100 UNIT/ML Solostar Pen; Inject 20 Units into the skin daily at 10 pm.  Dispense: 2 pen; Refill: 3  2. Encounter for immunization - Flu Vaccine QUAD 36+ mos IM - tdap today - pneumococcal 23v today  3.  hypothyroidism - will chk tsh today prior to renewing.  4. htn  - controlled, low salt diet discussed. - hydrochlorothiazide (HYDRODIURIL) 12.5 MG tablet; Take 1 tablet (12.5 mg  total) by mouth daily.  Dispense: 90 tablet; Refill: 3  5. Needs financial aid prior to ref to optho.     Patient have been counseled extensively about nutrition and exercise  Return in about 6 weeks (around 01/13/2016) for dm.  The patient was given clear instructions to go to ER or return to medical center if symptoms don't improve, worsen or new problems develop. The patient verbalized understanding. The patient was told to call to get lab results if they haven't heard anything in the next week.   This note has been created with Surveyor, quantity. Any transcriptional errors are unintentional.   Maren Reamer, MD, [facility] [place], [place]   12/02/2015, 11:26 AM

## 2015-12-02 NOTE — Progress Notes (Signed)
Pt is in the office today for a uti and medication refill Pt states she is not in any pain Pt states she is taking medications without difficulty

## 2015-12-02 NOTE — Patient Instructions (Addendum)
Ariana Cunningham [facility] 2 wks dm  Check blood sugars on waking up, before lunch and at bedtime for now.  Also check blood sugars about 2 hours after a meal and do this after different meals by rotation  Recommended blood sugar levels on waking up is 90-130 and about 2 hours after meal is 130-160  Please bring your blood sugar monitor to each visit, thank you  Restart exercise  start pravastatin//choleseterol med. - Diabetes Mellitus and Food It is important for you to manage your blood sugar (glucose) level. Your blood glucose level can be greatly affected by what you eat. Eating healthier foods in the appropriate amounts throughout the day at about the same time each day will help you control your blood glucose level. It can also help slow or prevent worsening of your diabetes mellitus. Healthy eating may even help you improve the level of your blood pressure and reach or maintain a healthy weight.  General recommendations for healthful eating and cooking habits include:  Eating meals and snacks regularly. Avoid going long periods of time without eating to lose weight.  Eating a diet that consists mainly of plant-based foods, such as fruits, vegetables, nuts, legumes, and whole grains.  Using low-heat cooking methods, such as baking, instead of high-heat cooking methods, such as deep frying. Work with your dietitian to make sure you understand how to use the Nutrition Facts information on food labels. HOW CAN FOOD AFFECT ME? Carbohydrates Carbohydrates affect your blood glucose level more than any other type of food. Your dietitian will help you determine how many carbohydrates to eat at each meal and teach you how to count carbohydrates. Counting carbohydrates is important to keep your blood glucose at a healthy level, especially if you are using insulin or taking certain medicines for diabetes mellitus. Alcohol Alcohol can cause sudden decreases in blood glucose (hypoglycemia), especially  if you use insulin or take certain medicines for diabetes mellitus. Hypoglycemia can be a life-threatening condition. Symptoms of hypoglycemia (sleepiness, dizziness, and disorientation) are similar to symptoms of having too much alcohol.  If your health care provider has given you approval to drink alcohol, do so in moderation and use the following guidelines:  Women should not have more than one drink per day, and men should not have more than two drinks per day. One drink is equal to:  12 oz of beer.  5 oz of wine.  1 oz of hard liquor.  Do not drink on an empty stomach.  Keep yourself hydrated. Have water, diet soda, or unsweetened iced tea.  Regular soda, juice, and other mixers might contain a lot of carbohydrates and should be counted. WHAT FOODS ARE NOT RECOMMENDED? As you make food choices, it is important to remember that all foods are not the same. Some foods have fewer nutrients per serving than other foods, even though they might have the same number of calories or carbohydrates. It is difficult to get your body what it needs when you eat foods with fewer nutrients. Examples of foods that you should avoid that are high in calories and carbohydrates but low in nutrients include:  Trans fats (most processed foods list trans fats on the Nutrition Facts label).  Regular soda.  Juice.  Candy.  Sweets, such as cake, pie, doughnuts, and cookies.  Fried foods. WHAT FOODS CAN I EAT? Eat nutrient-rich foods, which will nourish your body and keep you healthy. The food you should eat also will depend on several factors, including:  The calories you need.  The medicines you take.  Your weight.  Your blood glucose level.  Your blood pressure level.  Your cholesterol level. You should eat a variety of foods, including:  Protein.  Lean cuts of meat.  Proteins low in saturated fats, such as fish, egg whites, and beans. Avoid processed meats.  Fruits and  vegetables.  Fruits and vegetables that may help control blood glucose levels, such as apples, mangoes, and yams.  Dairy products.  Choose fat-free or low-fat dairy products, such as milk, yogurt, and cheese.  Grains, bread, pasta, and rice.  Choose whole grain products, such as multigrain bread, whole oats, and brown rice. These foods may help control blood pressure.  Fats.  Foods containing healthful fats, such as nuts, avocado, olive oil, canola oil, and fish. DOES EVERYONE WITH DIABETES MELLITUS HAVE THE SAME MEAL PLAN? Because every person with diabetes mellitus is different, there is not one meal plan that works for everyone. It is very important that you meet with a dietitian who will help you create a meal plan that is just right for you.   This information is not intended to replace advice given to you by your health care provider. Make sure you discuss any questions you have with your health care provider.   Document Released: 10/08/2004 Document Revised: 02/01/2014 Document Reviewed: 12/08/2012 Elsevier Interactive Patient Education 2016 [employer].  -  Tips for Eating Away From Home If You Have Diabetes Controlling your level of blood glucose, also known as blood sugar, can be challenging. It can be even more difficult when you do not prepare your own meals. The following tips can help you manage your diabetes when you eat away from home. PLANNING AHEAD Plan ahead if you know you will be eating away from home:  Ask your health care provider how to time meals and medicine if you are taking insulin.  Make a list of restaurants near you that offer healthy choices. If they have a carry-out menu, take it home and plan what you will order ahead of time.  Look up the restaurant you want to eat at online. Many chain and fast-food restaurants list nutritional information online. Use this information to choose the healthiest options and to calculate how many carbohydrates will  be in your meal.  Use a carbohydrate-counting book or mobile app to look up the carbohydrate content and serving size of the foods you want to eat.  Become familiar with serving sizes and learn to recognize how many servings are in a portion. This will allow you to estimate how many carbohydrates you can eat. FREE FOODS A "free food" is any food or drink that has less than 5 g of carbohydrates per serving. Free foods include:  Many vegetables.  Hard boiled eggs.  Nuts or seeds.  Olives.  Cheeses.  Meats. These types of foods make good appetizer choices and are often available at salad bars. Lemon juice, vinegar, or a low-calorie salad dressing of fewer than 20 calories per serving can be used as a "free" salad dressing.  CHOICES TO REDUCE CARBOHYDRATES  Substitute nonfat sweetened yogurt with a sugar-free yogurt. Yogurt made from soy milk may also be used, but you will still want a sugar-free or plain option to choose a lower carbohydrate amount.  Ask your server to take away the bread basket or chips from your table.  Order fresh fruit. A salad bar often offers fresh fruit choices. Avoid canned fruit because it is  usually packed in sugar or syrup.  Order a salad, and eat it without dressing. Or, create a "free" salad dressing.  Ask for substitutions. For example, instead of [place] fries, request an order of a vegetable such as salad, green beans, or broccoli. OTHER TIPS   If you take insulin, take the insulin once your food arrives to your table. This will ensure your insulin and food are timed correctly.  Ask your server about the portion size before your order, and ask for a take-out box if the portion has more servings than you should have. When your food comes, leave the amount you should have on the plate, and put the rest in the take-out box.  Consider splitting an entree with someone and ordering a side salad.   This information is not intended to replace advice given  to you by your health care provider. Make sure you discuss any questions you have with your health care provider.   Document Released: 01/11/2005 Document Revised: 10/02/2014 Document Reviewed: 04/10/2013 Elsevier Interactive Patient Education 2016 Elsevier Inc.  Pneumococcal Polysaccharide Vaccine: What You Need to Know 1. Why get vaccinated? Vaccination can protect older adults (and some children and younger adults) from pneumococcal disease. Pneumococcal disease is caused by bacteria that can spread from person to person through close contact. It can cause ear infections, and it can also lead to more serious infections of the:   Lungs (pneumonia),  Blood (bacteremia), and  Covering of the brain and spinal cord (meningitis). Meningitis can cause deafness and brain damage, and it can be fatal. Anyone can get pneumococcal disease, but children under 1 years of age, people with certain medical conditions, adults over 70 years of age, and cigarette smokers are at the highest risk. About 18,000 older adults die each year from pneumococcal disease in the [place]. Treatment of pneumococcal infections with penicillin and other drugs used to be more effective. But some strains of the disease have become resistant to these drugs. This makes prevention of the disease, through vaccination, even more important. 2. Pneumococcal polysaccharide vaccine (PPSV23) Pneumococcal polysaccharide vaccine (PPSV23) protects against 23 types of pneumococcal bacteria. It will not prevent all pneumococcal disease. PPSV23 is recommended for:  All adults 103 years of age and older,  Anyone 2 through 43 years of age with certain long-term health problems,  Anyone 2 through 42 years of age with a weakened immune system,  Adults 63 through 43 years of age who smoke cigarettes or have asthma. Most people need only one dose of PPSV. A second dose is recommended for certain high-risk groups. People 89 and older  should get a dose even if they have gotten one or more doses of the vaccine before they turned 65. Your healthcare provider can give you more information about these recommendations. Most healthy adults develop protection within 2 to 3 weeks of getting the shot. 3. Some people should not get this vaccine  Anyone who has had a life-threatening allergic reaction to PPSV should not get another dose.  Anyone who has a severe allergy to any component of PPSV should not receive it. Tell your provider if you have any severe allergies.  Anyone who is moderately or severely ill when the shot is scheduled may be asked to wait until they recover before getting the vaccine. Someone with a mild illness can usually be vaccinated.  Children less than 28 years of age should not receive this vaccine.  There is no evidence that PPSV is harmful  to either a pregnant woman or to her fetus. However, as a precaution, women who need the vaccine should be vaccinated before becoming pregnant, if possible. 4. Risks of a vaccine reaction With any medicine, including vaccines, there is a chance of side effects. These are usually mild and go away on their own, but serious reactions are also possible. About half of people who get PPSV have mild side effects, such as redness or pain where the shot is given, which go away within about two days. Less than 1 out of 100 people develop a fever, muscle aches, or more severe local reactions. Problems that could happen after any vaccine:  People sometimes faint after a medical procedure, including vaccination. Sitting or lying down for about 15 minutes can help prevent fainting, and injuries caused by a fall. Tell your doctor if you feel dizzy, or have vision changes or ringing in the ears.  Some people get severe pain in the shoulder and have difficulty moving the arm where a shot was given. This happens very rarely.  Any medication can cause a severe allergic reaction. Such  reactions from a vaccine are very rare, estimated at about 1 in a million doses, and would happen within a few minutes to a few hours after the vaccination. As with any medicine, there is a very remote chance of a vaccine causing a serious injury or death. The safety of vaccines is always being monitored. For more information, visit: http://floyd.org/ 5. What if there is a serious reaction? What should I look for? Look for anything that concerns you, such as signs of a severe allergic reaction, very high fever, or unusual behavior.  Signs of a severe allergic reaction can include hives, swelling of the face and throat, difficulty breathing, a fast heartbeat, dizziness, and weakness. These would usually start a few minutes to a few hours after the vaccination. What should I do? If you think it is a severe allergic reaction or other emergency that can't wait, call 9-1-1 or get to the nearest hospital. Otherwise, call your doctor. Afterward, the reaction should be reported to the Vaccine Adverse Event Reporting System (VAERS). Your doctor might file this report, or you can do it yourself through the VAERS web site at www.vaers.LAgents.no, or by calling 1-(859)079-6877.  VAERS does not give medical advice. 6. How can I learn more?  Ask your doctor. He or she can give you the vaccine package insert or suggest other sources of information.  Call your local or state health department.  Contact the Centers for Disease Control and Prevention (CDC):  Call 936 799 5392 (1-800-CDC-INFO) or  Visit CDC's website at PicCapture.uy CDC Pneumococcal Polysaccharide Vaccine VIS (05/18/13)   This information is not intended to replace advice given to you by your health care provider. Make sure you discuss any questions you have with your health care provider.   Document Released: 11/08/2005 Document Revised: 02/01/2014 Document Reviewed: 05/21/2013 Elsevier Interactive Patient Education 2016  [employer]. Tdap Vaccine (Tetanus, Diphtheria and Pertussis): What You Need to Know 1. Why get vaccinated? Tetanus, diphtheria and pertussis are very serious diseases. Tdap vaccine can protect [place] from these diseases. And, Tdap vaccine given to pregnant women can protect newborn babies against pertussis. TETANUS (Lockjaw) is rare in the [place] today. It causes painful muscle tightening and stiffness, usually all over the body.  It can lead to tightening of muscles in the head and neck so you can't open your mouth, swallow, or sometimes even breathe. Tetanus kills  about 1 out of 10 people who are infected even after receiving the best medical care. DIPHTHERIA is also rare in the [place] today. It can cause a thick coating to form in the back of the throat.  It can lead to breathing problems, heart failure, paralysis, and death. PERTUSSIS (Whooping Cough) causes severe coughing spells, which can cause difficulty breathing, vomiting and disturbed sleep.  It can also lead to weight loss, incontinence, and rib fractures. Up to 2 in 100 adolescents and 5 in 100 adults with pertussis are hospitalized or have complications, which could include pneumonia or death. These diseases are caused by bacteria. Diphtheria and pertussis are spread from person to person through secretions from coughing or sneezing. Tetanus enters the body through cuts, scratches, or wounds. Before vaccines, as many as 200,000 cases of diphtheria, 200,000 cases of pertussis, and hundreds of cases of tetanus, were reported in the [place] each year. Since vaccination began, reports of cases for tetanus and diphtheria have dropped by about 99% and for pertussis by about 80%. 2. Tdap vaccine Tdap vaccine can protect adolescents and adults from tetanus, diphtheria, and pertussis. One dose of Tdap is routinely given at age 43 or 212. People who did not get Tdap at that age should get it as soon as possible. Tdap is  especially important for healthcare professionals and anyone having close contact with a baby younger than 12 months. Pregnant women should get a dose of Tdap during every pregnancy, to protect the newborn from pertussis. Infants are most at risk for severe, life-threatening complications from pertussis. Another vaccine, called Td, protects against tetanus and diphtheria, but not pertussis. A Td booster should be given every 10 years. Tdap may be given as one of these boosters if you have never gotten Tdap before. Tdap may also be given after a severe cut or burn to prevent tetanus infection. Your doctor or the person giving you the vaccine can give you more information. Tdap may safely be given at the same time as other vaccines. 3. Some people should not get this vaccine  A person who has ever had a life-threatening allergic reaction after a previous dose of any diphtheria, tetanus or pertussis containing vaccine, OR has a severe allergy to any part of this vaccine, should not get Tdap vaccine. Tell the person giving the vaccine about any severe allergies.  Anyone who had coma or long repeated seizures within 7 days after a childhood dose of DTP or DTaP, or a previous dose of Tdap, should not get Tdap, unless a cause other than the vaccine was found. They can still get Td.  Talk to your doctor if you:  have seizures or another nervous system problem,  had severe pain or swelling after any vaccine containing diphtheria, tetanus or pertussis,  ever had a condition called Guillain-Barr Syndrome (GBS),  aren't feeling well on the day the shot is scheduled. 4. Risks With any medicine, including vaccines, there is a chance of side effects. These are usually mild and go away on their own. Serious reactions are also possible but are rare. Most people who get Tdap vaccine do not have any problems with it. Mild problems following Tdap (Did not interfere with activities)  Pain where the shot was  given (about 3 in 4 adolescents or 2 in 3 adults)  Redness or swelling where the shot was given (about 1 person in 5)  Mild fever of at least 100.15F (up to about 1 in 25 adolescents  or 1 in 100 adults)  Headache (about 3 or 4 people in 10)  Tiredness (about 1 person in 3 or 4)  Nausea, vomiting, diarrhea, stomach ache (up to 1 in 4 adolescents or 1 in 10 adults)  Chills, sore joints (about 1 person in 10)  Body aches (about 1 person in 3 or 4)  Rash, swollen glands (uncommon) Moderate problems following Tdap (Interfered with activities, but did not require medical attention)  Pain where the shot was given (up to 1 in 5 or 6)  Redness or swelling where the shot was given (up to about 1 in 16 adolescents or 1 in 12 adults)  Fever over 102F (about 1 in 100 adolescents or 1 in 250 adults)  Headache (about 1 in 7 adolescents or 1 in 10 adults)  Nausea, vomiting, diarrhea, stomach ache (up to 1 or 3 people in 100)  Swelling of the entire arm where the shot was given (up to about 1 in 500). Severe problems following Tdap (Unable to perform usual activities; required medical attention)  Swelling, severe pain, bleeding and redness in the arm where the shot was given (rare). Problems that could happen after any vaccine:  People sometimes faint after a medical procedure, including vaccination. Sitting or lying down for about 15 minutes can help prevent fainting, and injuries caused by a fall. Tell your doctor if you feel dizzy, or have vision changes or ringing in the ears.  Some people get severe pain in the shoulder and have difficulty moving the arm where a shot was given. This happens very rarely.  Any medication can cause a severe allergic reaction. Such reactions from a vaccine are very rare, estimated at fewer than 1 in a million doses, and would happen within a few minutes to a few hours after the vaccination. As with any medicine, there is a very remote chance of a vaccine  causing a serious injury or death. The safety of vaccines is always being monitored. For more information, visit: http://floyd.org/ 5. What if there is a serious problem? What should I look for?  Look for anything that concerns you, such as signs of a severe allergic reaction, very high fever, or unusual behavior.  Signs of a severe allergic reaction can include hives, swelling of the face and throat, difficulty breathing, a fast heartbeat, dizziness, and weakness. These would usually start a few minutes to a few hours after the vaccination. What should I do?  If you think it is a severe allergic reaction or other emergency that can't wait, call 9-1-1 or get the person to the nearest hospital. Otherwise, call your doctor.  Afterward, the reaction should be reported to the Vaccine Adverse Event Reporting System (VAERS). Your doctor might file this report, or you can do it yourself through the VAERS web site at www.vaers.LAgents.no, or by calling 1-(510)352-2332. VAERS does not give medical advice.  6. The National Vaccine Injury Compensation Program The [employer] Vaccine Injury Compensation Program (VICP) is a federal program that was created to compensate people who may have been injured by certain vaccines. Persons who believe they may have been injured by a vaccine can learn about the program and about filing a claim by calling 1-269-251-4796 or visiting the VICP website at SpiritualWord.at. There is a time limit to file a claim for compensation. 7. How can I learn more?  Ask your doctor. He or she can give you the vaccine package insert or suggest other sources of information.  Call your local  or state health department.  Contact the Centers for Disease Control and Prevention (CDC):  Call 70805299861-334-241-6137 (1-800-CDC-INFO) or  Visit CDC's website at PicCapture.uywww.cdc.gov/vaccines CDC Tdap Vaccine VIS (03/20/13)   This information is not intended to replace advice given to you  by your health care provider. Make sure you discuss any questions you have with your health care provider.   Document Released: 07/13/2011 Document Revised: 02/01/2014 Document Reviewed: 04/25/2013 Elsevier Interactive Patient Education 2016 Elsevier Inc. Influenza Virus Vaccine injection (Fluarix) What is this medicine? INFLUENZA VIRUS VACCINE (in floo EN zuh VAHY ruhs vak SEEN) helps to reduce the risk of getting influenza also known as the flu. This medicine may be used for other purposes; ask your health care provider or pharmacist if you have questions. What should I tell my health care provider before I take this medicine? They need to know if you have any of these conditions: -bleeding disorder like hemophilia -fever or infection -Guillain-Barre syndrome or other neurological problems -immune system problems -infection with the human immunodeficiency virus (HIV) or AIDS -low blood platelet counts -multiple sclerosis -an unusual or allergic reaction to influenza virus vaccine, eggs, chicken proteins, latex, gentamicin, other medicines, foods, dyes or preservatives -pregnant or trying to get pregnant -breast-feeding How should I use this medicine? This vaccine is for injection into a muscle. It is given by a health care professional. A copy of Vaccine Information Statements will be given before each vaccination. Read this sheet carefully each time. The sheet may change frequently. Talk to your pediatrician regarding the use of this medicine in children. Special care may be needed. Overdosage: If you think you have taken too much of this medicine contact a poison control center or emergency room at once. NOTE: This medicine is only for you. Do not share this medicine with others. What if I miss a dose? This does not apply. What may interact with this medicine? -chemotherapy or radiation therapy -medicines that lower your immune system like etanercept, anakinra, infliximab, and  adalimumab -medicines that treat or prevent blood clots like warfarin -phenytoin -steroid medicines like prednisone or cortisone -theophylline -vaccines This list may not describe all possible interactions. Give your health care provider a list of all the medicines, herbs, non-prescription drugs, or dietary supplements you use. Also tell them if you smoke, drink alcohol, or use illegal drugs. Some items may interact with your medicine. What should I watch for while using this medicine? Report any side effects that do not go away within 3 days to your doctor or health care professional. Call your health care provider if any unusual symptoms occur within 6 weeks of receiving this vaccine. You may still catch the flu, but the illness is not usually as bad. You cannot get the flu from the vaccine. The vaccine will not protect against colds or other illnesses that may cause fever. The vaccine is needed every year. What side effects may I notice from receiving this medicine? Side effects that you should report to your doctor or health care professional as soon as possible: -allergic reactions like skin rash, itching or hives, swelling of the face, lips, or tongue Side effects that usually do not require medical attention (report to your doctor or health care professional if they continue or are bothersome): -fever -headache -muscle aches and pains -pain, tenderness, redness, or swelling at site where injected -weak or tired This list may not describe all possible side effects. Call your doctor for medical advice about side effects. You may report  side effects to FDA at 1-800-FDA-1088. Where should I keep my medicine? This vaccine is only given in a clinic, pharmacy, doctor's office, or other health care setting and will not be stored at home. NOTE: This sheet is a summary. It may not cover all possible information. If you have questions about this medicine, talk to your doctor, pharmacist, or health  care provider.    2016, Elsevier/Gold Standard. (2007-08-09 09:30:40)

## 2015-12-03 ENCOUNTER — Encounter: Payer: Self-pay | Admitting: Internal Medicine

## 2015-12-03 ENCOUNTER — Telehealth: Payer: Self-pay

## 2015-12-03 LAB — MICROALBUMIN / CREATININE URINE RATIO
Creatinine, Urine: 83 mg/dL (ref 20–320)
Microalb Creat Ratio: 165 mcg/mg creat — ABNORMAL HIGH (ref <30)
Microalb, Ur: 13.7 mg/dL

## 2015-12-03 NOTE — Telephone Encounter (Signed)
Contacted pt to go over lab results pt is aware of results and doesn't have any questions or concerns 

## 2015-12-16 ENCOUNTER — Ambulatory Visit: Payer: Medicaid Other | Admitting: Pharmacist

## 2015-12-30 ENCOUNTER — Ambulatory Visit: Payer: Medicaid Other | Admitting: Pharmacist

## 2015-12-31 ENCOUNTER — Ambulatory Visit: Payer: Self-pay | Attending: Internal Medicine | Admitting: Pharmacist

## 2015-12-31 DIAGNOSIS — Z794 Long term (current) use of insulin: Secondary | ICD-10-CM | POA: Insufficient documentation

## 2015-12-31 DIAGNOSIS — E119 Type 2 diabetes mellitus without complications: Secondary | ICD-10-CM | POA: Insufficient documentation

## 2015-12-31 DIAGNOSIS — E1110 Type 2 diabetes mellitus with ketoacidosis without coma: Secondary | ICD-10-CM

## 2015-12-31 DIAGNOSIS — E1310 Other specified diabetes mellitus with ketoacidosis without coma: Secondary | ICD-10-CM

## 2015-12-31 LAB — GLUCOSE, POCT (MANUAL RESULT ENTRY): POC Glucose: 135 mg/dl — AB (ref 70–99)

## 2015-12-31 NOTE — Progress Notes (Signed)
    S:    Patient arrives in good spirits.  Presents for diabetes evaluation, education, and management at the request of Dr. Julien NordmannLangeland. Patient was referred on 12/02/15.  Patient was last seen by Primary Care Provider on 12/02/15.   Patient reports adherence with medications.  Current diabetes medications include: Lantus 20 units daily, glipizide 10 mg BID, metformin 1000 mg BID.  Patient reports hypoglycemic events. It was a reading of 69 that occurred on a day that she didn't eat. She is making sure that she eats enough each day to prevent hypoglycemia.  Patient reported dietary habits: patient reports that she has really been working on her diet and cutting out sugar and carbs.   Patient reported exercise habits: trying to be more active   Patient reports nocturia but much improved over the last few weeks.  Patient reports neuropathy but it is much less. Patient denies visual changes. Patient reports self foot exams.    O:  Lab Results  Component Value Date   HGBA1C 11.1 12/02/2015   There were no vitals filed for this visit.  Home fasting CBG: 80s-130s  2 hour post-prandial/random CBG: 69 - 200 (most <180)  POCT glucose  = 135   A/P: Diabetes longstanding currently UNcontrolled based on A1c of 11.1 but improving based on home CBGs. Patient reports hypoglycemic events and is able to verbalize appropriate hypoglycemia management plan. Patient reports adherence with medication. Control is suboptimal due to dietary indiscretion.  Continue all medications as currently prescribed - no changes at this time. Patient is doing very well with adherence to medications and lifestyle modifications. Congratulated patient on progress. She will follow up with Dr. Julien NordmannLangeland in 2 weeks as planned.   Next A1C anticipated February 2018.    Written patient instructions provided.  Total time in face to face counseling 20 minutes.   Follow up in Pharmacist Clinic Visit PRN .

## 2015-12-31 NOTE — Patient Instructions (Addendum)
Thanks for coming to see me!  You are doing great! Keep up the great work!  Follow up with Dr. Julien NordmannLangeland in 2 weeks (late December)

## 2016-01-13 ENCOUNTER — Ambulatory Visit: Payer: Self-pay | Attending: Internal Medicine | Admitting: Internal Medicine

## 2016-01-13 ENCOUNTER — Encounter: Payer: Self-pay | Admitting: Internal Medicine

## 2016-01-13 VITALS — BP 147/86 | HR 70 | Temp 98.7°F | Resp 16 | Wt 168.2 lb

## 2016-01-13 DIAGNOSIS — E785 Hyperlipidemia, unspecified: Secondary | ICD-10-CM | POA: Insufficient documentation

## 2016-01-13 DIAGNOSIS — E1110 Type 2 diabetes mellitus with ketoacidosis without coma: Secondary | ICD-10-CM | POA: Insufficient documentation

## 2016-01-13 DIAGNOSIS — E039 Hypothyroidism, unspecified: Secondary | ICD-10-CM | POA: Insufficient documentation

## 2016-01-13 DIAGNOSIS — Z794 Long term (current) use of insulin: Secondary | ICD-10-CM | POA: Insufficient documentation

## 2016-01-13 DIAGNOSIS — E559 Vitamin D deficiency, unspecified: Secondary | ICD-10-CM | POA: Insufficient documentation

## 2016-01-13 DIAGNOSIS — E1310 Other specified diabetes mellitus with ketoacidosis without coma: Secondary | ICD-10-CM

## 2016-01-13 DIAGNOSIS — I10 Essential (primary) hypertension: Secondary | ICD-10-CM | POA: Insufficient documentation

## 2016-01-13 LAB — TSH: TSH: 24.62 mIU/L — ABNORMAL HIGH

## 2016-01-13 LAB — T4, FREE: Free T4: 0.5 ng/dL — ABNORMAL LOW (ref 0.8–1.8)

## 2016-01-13 LAB — GLUCOSE, POCT (MANUAL RESULT ENTRY): POC Glucose: 192 mg/dl — AB (ref 70–99)

## 2016-01-13 MED ORDER — LISINOPRIL-HYDROCHLOROTHIAZIDE 20-25 MG PO TABS: 1.0000 | ORAL_TABLET | Freq: Every day | ORAL | 3 refills | Status: DC

## 2016-01-13 MED ORDER — METFORMIN HCL ER 500 MG PO TB24: 1000.0000 mg | ORAL_TABLET | Freq: Two times a day (BID) | ORAL | 4 refills | Status: DC

## 2016-01-13 MED ORDER — PRAVASTATIN SODIUM 20 MG PO TABS: 20.0000 mg | ORAL_TABLET | Freq: Every day | ORAL | 3 refills | Status: DC

## 2016-01-13 MED ORDER — INSULIN PEN NEEDLE 32G X 4 MM MISC: 1.0000 | Freq: Every day | 2 refills | Status: DC

## 2016-01-13 MED ORDER — INSULIN GLARGINE 100 UNIT/ML SOLOSTAR PEN: 20.0000 [IU] | PEN_INJECTOR | Freq: Every day | SUBCUTANEOUS | 3 refills | Status: DC

## 2016-01-13 MED ORDER — GLIPIZIDE 10 MG PO TABS
10.0000 mg | ORAL_TABLET | Freq: Two times a day (BID) | ORAL | 3 refills | Status: DC
Start: 2016-01-13 — End: 2016-08-12

## 2016-01-13 NOTE — Progress Notes (Signed)
Ariana Cunningham, is a 45 y.o. female  TJQ:[id number]  TMA:[id number]  DOB - 1970/12/20  Chief Complaint  Patient presents with  . Diabetes        Subjective:   Ariana Cunningham is a 45 y.o. female here today for a follow up visit.  She states she has been doing much better on her diet and meds.  Taking all meds as prescribed, and really working on watching her food intake, avoiding carbs, etc.  Very difficult during Thanksgiving though.  Denies tob/etoh. Finished her vit d rx. Has not been to see optho yet, still working on her insurance at this time. Anxious and rushing to get to appt this am.  Patient has No headache, No chest pain, No abdominal pain - No Nausea, No new weakness tingling or numbness, No Cough - SOB.  No problems updated.  ALLERGIES: No Known Allergies  PAST MEDICAL HISTORY: Past Medical History:  Diagnosis Date  . Diabetes mellitus   . Hyperlipidemia   . Thyroid condition     MEDICATIONS AT HOME: Prior to Admission medications   Medication Sig Start Date End Date Taking? Authorizing Provider  Blood Glucose Monitoring Suppl (TRUE METRIX METER) w/Device KIT 1 each by Does not apply route as needed. 12/02/15   Maren Reamer, MD  fluticasone (FLONASE) 50 MCG/ACT nasal spray Place 2 sprays into both nostrils daily. Patient not taking: Reported on 12/02/2015 07/26/13   Lorayne Marek, MD  glipiZIDE (GLUCOTROL) 10 MG tablet Take 1 tablet (10 mg total) by mouth 2 (two) times daily before a meal. 01/13/16   Maren Reamer, MD  glucose blood (TRUE METRIX BLOOD GLUCOSE TEST) test strip 1 each by Other route 3 (three) times daily. 12/02/15   Maren Reamer, MD  Insulin Glargine (LANTUS SOLOSTAR) 100 UNIT/ML Solostar Pen Inject 20 Units into the skin daily at 10 pm. 01/13/16   Maren Reamer, MD  Insulin Pen Needle (ULTICARE MICRO PEN NEEDLES) 32G X 4 MM MISC 1 applicator by Does not apply route at bedtime. 01/13/16   Maren Reamer, MD  Lancets Misc.  (ACCU-CHEK MULTICLIX LANCET DEV) KIT 1 kit by Does not apply route 4 (four) times daily -  before meals and at bedtime. Patient not taking: Reported on 12/02/2015 11/23/12   Thurnell Lose, MD  levothyroxine (SYNTHROID, LEVOTHROID) 200 MCG tablet Take 1 tablet (200 mcg total) by mouth daily. 04/28/15   Maren Reamer, MD  levothyroxine (SYNTHROID, LEVOTHROID) 50 MCG tablet Take 1 tablet (50 mcg total) by mouth daily. 04/29/15   Maren Reamer, MD  lisinopril-hydrochlorothiazide (PRINZIDE,ZESTORETIC) 20-25 MG tablet Take 1 tablet by mouth daily. 01/13/16   Maren Reamer, MD  metFORMIN (GLUCOPHAGE XR) 500 MG 24 hr tablet Take 2 tablets (1,000 mg total) by mouth 2 (two) times daily with a meal. 01/13/16   Maren Reamer, MD  pravastatin (PRAVACHOL) 20 MG tablet Take 1 tablet (20 mg total) by mouth daily. 01/13/16   Maren Reamer, MD  TRUEPLUS LANCETS 28G MISC 1 each by Does not apply route 3 (three) times daily. 12/02/15   Maren Reamer, MD  Vitamin D, Ergocalciferol, (DRISDOL) 50000 units CAPS capsule Take 1 capsule (50,000 Units total) by mouth every 7 (seven) days. Patient not taking: Reported on 12/02/2015 04/29/15   Maren Reamer, MD     Objective:   Vitals:   01/13/16 0936  BP: (!) 147/86  Pulse: 70  Resp: 16  Temp: 98.7 F (37.1  C)  TempSrc: Oral  SpO2: 99%  Weight: 168 lb 3.2 oz (76.3 kg)    Exam General appearance : Awake, alert, not in any distress. Speech Clear. Not toxic looking, pleasant. HEENT: Atraumatic and Normocephalic, pupils equally reactive to light. Neck: supple, no JVD.   Chest:Good air entry bilaterally, no added sounds. CVS: S1 S2 regular, no murmurs/gallups or rubs. Abdomen: Bowel sounds active, Non tender and not distended with no gaurding, rigidity or rebound. Foot exam: bilateral peripheral pulses 2+ (dorsalis pedis and post tibialis pulses), no ulcers noted/no ecchymosis, warm to touch, monofilament testing 3/3 bilat. Sensation intact.  No c/c.  Trace edema bilat foot, R >L.  Chronic right le swelling from trauma as child. Neurology: Awake alert, and oriented X 3, CN II-XII grossly intact, Non focal Skin:No Rash  Data Review Lab Results  Component Value Date   HGBA1C 11.1 12/02/2015   HGBA1C 8.1 04/28/2015   HGBA1C 8.3 05/29/2014    Depression screen PHQ 2/9 01/13/2016 12/02/2015 05/22/2015 04/28/2015 05/04/2013  Decreased Interest 0 0 0 0 0  Down, Depressed, Hopeless 0 0 0 0 0  PHQ - 2 Score 0 0 0 0 0      Assessment & Plan   1. Uncontrolled type 2 diabetes mellitus with ketoacidosis without coma, without long-term current use of insulin ([place]) - encouraged exercise and watching diet. - d/w pt self titration of lantus q3 days, info provided, and pt able to repeat the instructions. - POCT glucose (manual entry) - Insulin Glargine (LANTUS SOLOSTAR) 100 UNIT/ML Solostar Pen; Inject 20 Units into the skin daily at 10 pm.  Dispense: 2 pen; Refill: 3 - glipiZIDE (GLUCOTROL) 10 MG tablet; Take 1 tablet (10 mg total) by mouth 2 (two) times daily before a meal.  Dispense: 60 tablet; Refill: 3 - Ambulatory referral to Ophthalmology - [facility] chk 2wks  2. Hypothyroidism, unspecified type - hold off on renewal until repeat levels - TSH - T4, Free  3. Htn, slightly elevated, Goal <130/80 - change hctz 12.5 and lisinopril to combined Prinzide 20-25 qd Low salt diet discussed today as well.  4. Vitamin D deficiency - finished rx, still taking otc but intermittently, will rchk levels. - VITAMIN D 25 Hydroxy (Vit-D Deficiency, Fractures)     Patient have been counseled extensively about nutrition and exercise  Return in about 3 months (around 04/12/2016), or if symptoms worsen or fail to improve.  The patient was given clear instructions to go to ER or return to medical center if symptoms don't improve, worsen or new problems develop. The patient verbalized understanding. The patient was told to call to get  lab results if they haven't heard anything in the next week.   This note has been created with Surveyor, quantity. Any transcriptional errors are unintentional.   Maren Reamer, MD, [facility] [facility] [place], [place]   01/13/2016, 10:08 AM

## 2016-01-13 NOTE — Patient Instructions (Signed)
-   [facility] /dm chk.   Check blood sugars on waking up daily., and at least 1-2 times rest of day.    Also check blood sugars about 2 hours after a meal and do this after different meals by rotation  Recommended blood sugar levels on waking up is 90-130 and about 2 hours after meal is 130-160  Please bring your blood sugar monitor to each visit, thank you  Restart exercise  Continue cholesterol med.   - Lantus Titration  Instructions:  IF AM blood sugar is  >150, increase lantus by 1 unit at night.  Continue to check sugars daily. On day 3, if blood sugar >150, continue to increase lantus by 1 unit.  If blood sugar < 70s, than need to reduce dose by 1 unit nightly.  Continue to check blood sugars in am daily and adjust per above.  -- please call us if you have any questions

## 2016-01-14 ENCOUNTER — Other Ambulatory Visit: Payer: Self-pay | Admitting: Internal Medicine

## 2016-01-14 DIAGNOSIS — E039 Hypothyroidism, unspecified: Secondary | ICD-10-CM

## 2016-01-14 LAB — VITAMIN D 25 HYDROXY (VIT D DEFICIENCY, FRACTURES): Vit D, 25-Hydroxy: 17 ng/mL — ABNORMAL LOW (ref 30–100)

## 2016-01-14 MED ORDER — VITAMIN D (ERGOCALCIFEROL) 1.25 MG (50000 UNIT) PO CAPS: 50000.0000 [IU] | ORAL_CAPSULE | ORAL | 0 refills | Status: AC

## 2016-01-14 MED ORDER — LEVOTHYROXINE SODIUM 200 MCG PO TABS: 200.0000 ug | ORAL_TABLET | Freq: Every day | ORAL | 5 refills | Status: DC

## 2016-01-14 MED ORDER — LEVOTHYROXINE SODIUM 75 MCG PO TABS: 75.0000 ug | ORAL_TABLET | Freq: Every day | ORAL | 3 refills | Status: DC

## 2016-01-15 ENCOUNTER — Telehealth: Payer: Self-pay

## 2016-01-15 NOTE — Telephone Encounter (Signed)
Contacted pt to go over lab results pt didn't answer was unable to lvm 

## 2016-01-27 NOTE — Progress Notes (Deleted)
    S:    Patient arrives in good spirits.  Presents for diabetes evaluation, education, and management at the request of Dr. Langeland. Patient was referred on 12/02/15.  Patient was last seen by Primary Care Provider on 12/02/15.   Patient reports adherence with medications.  Current diabetes medications include: Lantus 20 units daily, glipizide 10 mg BID, metformin 1000 mg BID.  Patient reports hypoglycemic events. It was a reading of 69 that occurred on a day that she didn't eat. She is making sure that she eats enough each day to prevent hypoglycemia.  Patient reported dietary habits: patient reports that she has really been working on her diet and cutting out sugar and carbs.   Patient reported exercise habits: trying to be more active   Patient reports nocturia but much improved over the last few weeks.  Patient reports neuropathy but it is much less. Patient denies visual changes. Patient reports self foot exams.    O:  Lab Results  Component Value Date   HGBA1C 11.1 12/02/2015   There were no vitals filed for this visit.  Home fasting CBG: 80s-130s  2 hour post-prandial/random CBG: 69 - 200 (most <180)  POCT glucose  = 135   A/P: Diabetes longstanding currently UNcontrolled based on A1c of 11.1 but improving based on home CBGs. Patient reports hypoglycemic events and is able to verbalize appropriate hypoglycemia management plan. Patient reports adherence with medication. Control is suboptimal due to dietary indiscretion.  Continue all medications as currently prescribed - no changes at this time. Patient is doing very well with adherence to medications and lifestyle modifications. Congratulated patient on progress. She will follow up with Dr. Langeland in 2 weeks as planned.   Next A1C anticipated February 2018.    Written patient instructions provided.  Total time in face to face counseling 20 minutes.   Follow up in Pharmacist Clinic Visit PRN .      

## 2016-01-28 ENCOUNTER — Ambulatory Visit: Payer: Medicaid Other | Admitting: Pharmacist

## 2016-01-29 MED FILL — LANTUS SOLOSTAR 100 UNITS/M: 100 | 30 days supply | Qty: 6 | Fill #0

## 2016-01-29 MED FILL — VIT D2 1.25 MG (50,000 UNIT: 1.25 MG | 84 days supply | Qty: 12 | Fill #0

## 2016-01-29 MED FILL — METFORMIN HCL ER 500 MG TAB: 500 | 30 days supply | Qty: 120 | Fill #0

## 2016-01-29 MED FILL — TRUEPLUS PEN NDL 32GX5/32": 32GX 5/32" | 30 days supply | Qty: 100 | Fill #0

## 2016-01-29 MED FILL — PRAVASTATIN NA 20 MG TAB: 20 | 30 days supply | Qty: 30 | Fill #0

## 2016-01-29 MED FILL — TRUEPLUS PEN NDL 32GX5/32: 32GX 5/32" | 30 days supply | Qty: 100 | Fill #0

## 2016-01-29 MED FILL — ?GLIPIZIDE 10 MG TABLET: 10 | 30 days supply | Qty: 60 | Fill #0

## 2016-01-29 MED FILL — LISINOPRIL-HCTZ 20-25 MG TA: 20-25 | 30 days supply | Qty: 30 | Fill #0

## 2016-01-29 MED FILL — LEVOTHYROXINE 75 MCG TABLET: 75 | 30 days supply | Qty: 30 | Fill #0

## 2016-04-15 MED FILL — !LANTUS SOLOSTAR 100UNITS/M: 100 | 30 days supply | Qty: 6 | Fill #1

## 2016-04-26 ENCOUNTER — Ambulatory Visit: Payer: Medicaid Other | Attending: Internal Medicine

## 2016-04-26 MED FILL — TRUE METRIX TEST STRIP: 30 days supply | Qty: 100 | Fill #1

## 2016-05-03 ENCOUNTER — Other Ambulatory Visit: Payer: Self-pay | Admitting: *Deleted

## 2016-05-03 MED ORDER — LEVOTHYROXINE SODIUM 200 MCG PO TABS: 200.0000 ug | ORAL_TABLET | Freq: Every day | ORAL | 3 refills | Status: DC

## 2016-05-03 MED ORDER — LEVOTHYROXINE SODIUM 75 MCG PO TABS: 75.0000 ug | ORAL_TABLET | Freq: Every day | ORAL | 3 refills | Status: DC

## 2016-05-03 NOTE — Telephone Encounter (Signed)
PRINTED FOR PASS PROGRAM 

## 2016-05-11 ENCOUNTER — Ambulatory Visit: Payer: Medicaid Other | Attending: Internal Medicine

## 2016-06-10 ENCOUNTER — Encounter: Payer: Self-pay | Admitting: Internal Medicine

## 2016-06-11 ENCOUNTER — Encounter: Payer: Self-pay | Admitting: Internal Medicine

## 2016-06-14 ENCOUNTER — Encounter: Payer: Self-pay | Admitting: Internal Medicine

## 2016-08-03 ENCOUNTER — Other Ambulatory Visit: Payer: Self-pay | Admitting: *Deleted

## 2016-08-03 DIAGNOSIS — E1110 Type 2 diabetes mellitus with ketoacidosis without coma: Secondary | ICD-10-CM

## 2016-08-03 MED ORDER — INSULIN GLARGINE 100 UNIT/ML SOLOSTAR PEN: 20.0000 [IU] | PEN_INJECTOR | Freq: Every day | SUBCUTANEOUS | 3 refills | Status: DC

## 2016-08-03 NOTE — Telephone Encounter (Signed)
PRINTED FOR PASS PROGRAM 

## 2016-08-12 ENCOUNTER — Ambulatory Visit: Payer: Self-pay | Attending: Family Medicine | Admitting: Family Medicine

## 2016-08-12 ENCOUNTER — Encounter: Payer: Self-pay | Admitting: Family Medicine

## 2016-08-12 VITALS — BP 157/72 | HR 77 | Temp 98.7°F | Resp 18 | Ht 65.0 in | Wt 159.0 lb

## 2016-08-12 DIAGNOSIS — Z79899 Other long term (current) drug therapy: Secondary | ICD-10-CM | POA: Insufficient documentation

## 2016-08-12 DIAGNOSIS — E039 Hypothyroidism, unspecified: Secondary | ICD-10-CM

## 2016-08-12 DIAGNOSIS — IMO0001 Reserved for inherently not codable concepts without codable children: Secondary | ICD-10-CM

## 2016-08-12 DIAGNOSIS — Z794 Long term (current) use of insulin: Secondary | ICD-10-CM | POA: Insufficient documentation

## 2016-08-12 DIAGNOSIS — E1165 Type 2 diabetes mellitus with hyperglycemia: Secondary | ICD-10-CM

## 2016-08-12 DIAGNOSIS — I10 Essential (primary) hypertension: Secondary | ICD-10-CM

## 2016-08-12 DIAGNOSIS — E119 Type 2 diabetes mellitus without complications: Secondary | ICD-10-CM | POA: Insufficient documentation

## 2016-08-12 DIAGNOSIS — E785 Hyperlipidemia, unspecified: Secondary | ICD-10-CM | POA: Insufficient documentation

## 2016-08-12 LAB — POCT UA - MICROALBUMIN
Creatinine, POC: 50 mg/dL
MICROALBUMIN (UR) POC: 30 mg/L

## 2016-08-12 LAB — GLUCOSE, POCT (MANUAL RESULT ENTRY): POC Glucose: 153 mg/dl — AB (ref 70–99)

## 2016-08-12 LAB — POCT GLYCOSYLATED HEMOGLOBIN (HGB A1C): Hemoglobin A1C: 9.3

## 2016-08-12 MED ORDER — LISINOPRIL-HYDROCHLOROTHIAZIDE 20-25 MG PO TABS: 1.0000 | ORAL_TABLET | Freq: Every day | ORAL | 3 refills | Status: DC

## 2016-08-12 MED ORDER — GLUCOSE BLOOD VI STRP: 1.0000 | ORAL_STRIP | Freq: Three times a day (TID) | 11 refills | Status: DC

## 2016-08-12 MED ORDER — GLIPIZIDE 10 MG PO TABS: 10.0000 mg | ORAL_TABLET | Freq: Two times a day (BID) | ORAL | 3 refills | Status: DC

## 2016-08-12 MED ORDER — INSULIN PEN NEEDLE 32G X 4 MM MISC: 1.0000 | Freq: Every day | 2 refills | Status: AC

## 2016-08-12 MED ORDER — TRUEPLUS LANCETS 28G MISC: 1.0000 | Freq: Three times a day (TID) | 11 refills | Status: AC

## 2016-08-12 MED ORDER — METFORMIN HCL ER 500 MG PO TB24: 1000.0000 mg | ORAL_TABLET | Freq: Two times a day (BID) | ORAL | 4 refills | Status: DC

## 2016-08-12 MED ORDER — INSULIN GLARGINE 100 UNIT/ML SOLOSTAR PEN: 25.0000 [IU] | PEN_INJECTOR | Freq: Every day | SUBCUTANEOUS | 3 refills | Status: DC

## 2016-08-12 NOTE — Patient Instructions (Signed)
Diabetes Mellitus and Food It is important for you to manage your blood sugar (glucose) level. Your blood glucose level can be greatly affected by what you eat. Eating healthier foods in the appropriate amounts throughout the day at about the same time each day will help you control your blood glucose level. It can also help slow or prevent worsening of your diabetes mellitus. Healthy eating may even help you improve the level of your blood pressure and reach or maintain a healthy weight. General recommendations for healthful eating and cooking habits include:  Eating meals and snacks regularly. Avoid going long periods of time without eating to lose weight.  Eating a diet that consists mainly of plant-based foods, such as fruits, vegetables, nuts, legumes, and whole grains.  Using low-heat cooking methods, such as baking, instead of high-heat cooking methods, such as deep frying.  Work with your dietitian to make sure you understand how to use the Nutrition Facts information on food labels. How can food affect me? Carbohydrates Carbohydrates affect your blood glucose level more than any other type of food. Your dietitian will help you determine how many carbohydrates to eat at each meal and teach you how to count carbohydrates. Counting carbohydrates is important to keep your blood glucose at a healthy level, especially if you are using insulin or taking certain medicines for diabetes mellitus. Alcohol Alcohol can cause sudden decreases in blood glucose (hypoglycemia), especially if you use insulin or take certain medicines for diabetes mellitus. Hypoglycemia can be a life-threatening condition. Symptoms of hypoglycemia (sleepiness, dizziness, and disorientation) are similar to symptoms of having too much alcohol. If your health care provider has given you approval to drink alcohol, do so in moderation and use the following guidelines:  Women should not have more than one drink per day, and men  should not have more than two drinks per day. One drink is equal to: ? 12 oz of beer. ? 5 oz of wine. ? 1 oz of hard liquor.  Do not drink on an empty stomach.  Keep yourself hydrated. Have water, diet soda, or unsweetened iced tea.  Regular soda, juice, and other mixers might contain a lot of carbohydrates and should be counted.  What foods are not recommended? As you make food choices, it is important to remember that all foods are not the same. Some foods have fewer nutrients per serving than other foods, even though they might have the same number of calories or carbohydrates. It is difficult to get your body what it needs when you eat foods with fewer nutrients. Examples of foods that you should avoid that are high in calories and carbohydrates but low in nutrients include:  Trans fats (most processed foods list trans fats on the Nutrition Facts label).  Regular soda.  Juice.  Candy.  Sweets, such as cake, pie, doughnuts, and cookies.  Fried foods.  What foods can I eat? Eat nutrient-rich foods, which will nourish your body and keep you healthy. The food you should eat also will depend on several factors, including:  The calories you need.  The medicines you take.  Your weight.  Your blood glucose level.  Your blood pressure level.  Your cholesterol level.  You should eat a variety of foods, including:  Protein. ? Lean cuts of meat. ? Proteins low in saturated fats, such as fish, egg whites, and beans. Avoid processed meats.  Fruits and vegetables. ? Fruits and vegetables that may help control blood glucose levels, such as apples,   mangoes, and yams.  Dairy products. ? Choose fat-free or low-fat dairy products, such as milk, yogurt, and cheese.  Grains, bread, pasta, and rice. ? Choose whole grain products, such as multigrain bread, whole oats, and brown rice. These foods may help control blood pressure.  Fats. ? Foods containing healthful fats, such as  nuts, avocado, olive oil, canola oil, and fish.  Does everyone with diabetes mellitus have the same meal plan? Because every person with diabetes mellitus is different, there is not one meal plan that works for everyone. It is very important that you meet with a dietitian who will help you create a meal plan that is just right for you. This information is not intended to replace advice given to you by your health care provider. Make sure you discuss any questions you have with your health care provider. Document Released: 10/08/2004 Document Revised: 06/19/2015 Document Reviewed: 12/08/2012 Elsevier Interactive Patient Education  2017 [employer]  

## 2016-08-12 NOTE — Progress Notes (Signed)
 Subjective:  Patient ID: Ariana Cunningham, female    DOB: 03/12/1970  Age: 46 y.o. MRN: [id number]  CC: Diabetes   HPI [name]  presents for follow up of diabetes. Symptoms: none. Patient denies foot ulcerations, nausea, paresthesia of the feet, polydipsia, polyuria, visual disturbances and vomitting.  Evaluation to date has been included: fasting lipid panel, hemoglobin A1C and microalbuminuria.  Home sugars: 1 night per week. Treatment to date: metformin, glipizide, lantus. History of hypertension.  She is not exercising and is not adherent to low salt diet. She does not check BP at home. She reports being without her blood pressure medications for a couple weeks. Cardiac symptoms none. Patient denies chest pain, chest pressure/discomfort, claudication, dyspnea, lower extremity edema, near-syncope, palpitations and syncope.  Cardiovascular risk factors: diabetes mellitus, dyslipidemia, hypertension and sedentary lifestyle. Use of agents associated with hypertension: none. History of target organ damage: none. History of hypothyroidism. She denies fatigue, weight changes, heat/cold intolerance, bowel/skin changes or CVS symptoms. Previous thyroid studies include TSH and total T4.    Outpatient Medications Prior to Visit  Medication Sig Dispense Refill  . Blood Glucose Monitoring Suppl (TRUE METRIX METER) w/Device KIT 1 each by Does not apply route as needed. 1 kit 0  . Lancets Misc. (ACCU-CHEK MULTICLIX LANCET DEV) KIT 1 kit by Does not apply route 4 (four) times daily -  before meals and at bedtime. 1 each 12  . levothyroxine (SYNTHROID, LEVOTHROID) 200 MCG tablet Take 1 tablet (200 mcg total) by mouth daily. 90 tablet 3  . levothyroxine (SYNTHROID, LEVOTHROID) 75 MCG tablet Take 1 tablet (75 mcg total) by mouth daily. 90 tablet 3  . pravastatin (PRAVACHOL) 20 MG tablet Take 1 tablet (20 mg total) by mouth daily. 90 tablet 3  . Vitamin D, Ergocalciferol, (DRISDOL) 50000 units CAPS  capsule Take 1 capsule (50,000 Units total) by mouth every 7 (seven) days. 12 capsule 0  . fluticasone (FLONASE) 50 MCG/ACT nasal spray Place 2 sprays into both nostrils daily. 16 g 6  . glipiZIDE (GLUCOTROL) 10 MG tablet Take 1 tablet (10 mg total) by mouth 2 (two) times daily before a meal. 60 tablet 3  . glucose blood (TRUE METRIX BLOOD GLUCOSE TEST) test strip 1 each by Other route 3 (three) times daily. 100 each 11  . Insulin Glargine (LANTUS SOLOSTAR) 100 UNIT/ML Solostar Pen Inject 20 Units into the skin daily at 10 pm. 30 mL 3  . Insulin Pen Needle (ULTICARE MICRO PEN NEEDLES) 32G X 4 MM MISC 1 applicator by Does not apply route at bedtime. 100 each 2  . lisinopril-hydrochlorothiazide (PRINZIDE,ZESTORETIC) 20-25 MG tablet Take 1 tablet by mouth daily. 90 tablet 3  . metFORMIN (GLUCOPHAGE XR) 500 MG 24 hr tablet Take 2 tablets (1,000 mg total) by mouth 2 (two) times daily with a meal. 120 tablet 4  . TRUEPLUS LANCETS 28G MISC 1 each by Does not apply route 3 (three) times daily. 100 each 11   No facility-administered medications prior to visit.     ROS Review of Systems  Constitutional: Negative.   Eyes: Negative.   Respiratory: Negative.   Cardiovascular: Negative.   Gastrointestinal: Negative.   Endocrine: Negative.   Genitourinary: Negative.   Skin: Negative.   Neurological: Negative.    Objective:  BP (!) 157/72 (BP Location: Left Arm, Patient Position: Sitting, Cuff Size: Normal)   Pulse 77   Temp 98.7 F (37.1 C) (Oral)   Resp 18   Ht 5' 5" (  1.651 m)   Wt 159 lb (72.1 kg)   SpO2 100%   BMI 26.46 kg/m   BP/Weight 08/12/2016 01/13/2016 52/08/4130  Systolic BP 440 102 725  Diastolic BP 72 86 85  Wt. (Lbs) 159 168.2 172.6  BMI 26.46 28.87 29.63   Physical Exam  Constitutional: She appears well-developed and well-nourished.  Eyes: Pupils are equal, round, and reactive to light. Conjunctivae are normal.  Cardiovascular: Normal rate, regular rhythm, normal heart  sounds and intact distal pulses.   Pulmonary/Chest: Effort normal and breath sounds normal.  Abdominal: Soft. Bowel sounds are normal.  Skin: Skin is warm and dry.  Nursing note and vitals reviewed.   Assessment & Plan:   Problem List Items Addressed This Visit      Cardiovascular and Mediastinum   HTN (hypertension), benign (Chronic)   If BP is greater than 90/60 (MAP 65 or greater) but not less than 130/80 may add dose of amlodipine .    5 mg QD  and recheck in another 2 clinic RN   Follow-up with PCP in 3 months    Relevant Medications   lisinopril-hydrochlorothiazide (PRINZIDE,ZESTORETIC) 20-25 MG tablet   Other Relevant Orders   POCT UA - Microalbumin (Completed)     Endocrine   Hypothyroidism   Relevant Orders   TSH (Completed)   T4, Free (Completed)    Other Visit Diagnoses    Uncontrolled type 2 diabetes mellitus without complication, with long-term current use of insulin ([facility])    -  Primary   Relevant Medications   glucose blood (TRUE METRIX BLOOD GLUCOSE TEST) test strip   TRUEPLUS LANCETS 28G MISC   glipiZIDE (GLUCOTROL) 10 MG tablet   Insulin Glargine (LANTUS SOLOSTAR) 100 UNIT/ML Solostar Pen   Insulin Pen Needle (ULTICARE MICRO PEN NEEDLES) 32G X 4 MM MISC   lisinopril-hydrochlorothiazide (PRINZIDE,ZESTORETIC) 20-25 MG tablet   metFORMIN (GLUCOPHAGE XR) 500 MG 24 hr tablet   Other Relevant Orders   POCT A1C (Completed)   Glucose (CBG) (Completed)   CMP14+EGFR (Completed)   Lipid Panel (Completed)      Meds ordered this encounter  Medications  . glucose blood (TRUE METRIX BLOOD GLUCOSE TEST) test strip    Sig: 1 each by Other route 3 (three) times daily.    Dispense:  100 each    Refill:  11    Order Specific Question:   Supervising Provider    Answer:   Tresa Garter [id number]  . TRUEPLUS LANCETS 28G MISC    Sig: 1 each by Does not apply route 3 (three) times daily.    Dispense:  100 each    Refill:  11    Order Specific Question:    Supervising Provider    Answer:   Tresa Garter [id number]  . glipiZIDE (GLUCOTROL) 10 MG tablet    Sig: Take 1 tablet (10 mg total) by mouth 2 (two) times daily before a meal.    Dispense:  60 tablet    Refill:  3    Order Specific Question:   Supervising Provider    Answer:   Tresa Garter [id number]  . Insulin Glargine (LANTUS SOLOSTAR) 100 UNIT/ML Solostar Pen    Sig: Inject 25 Units into the skin daily at 10 pm.    Dispense:  30 mL    Refill:  3    Order Specific Question:   Supervising Provider    Answer:   Tresa Garter [id number]  . Insulin Pen Needle (ULTICARE MICRO PEN  NEEDLES) 32G X 4 MM MISC    Sig: 1 applicator by Does not apply route at bedtime.    Dispense:  100 each    Refill:  2    Order Specific Question:   Supervising Provider    Answer:   Tresa Garter [id number]  . lisinopril-hydrochlorothiazide (PRINZIDE,ZESTORETIC) 20-25 MG tablet    Sig: Take 1 tablet by mouth daily.    Dispense:  90 tablet    Refill:  3    Order Specific Question:   Supervising Provider    Answer:   Tresa Garter [id number]  . metFORMIN (GLUCOPHAGE XR) 500 MG 24 hr tablet    Sig: Take 2 tablets (1,000 mg total) by mouth 2 (two) times daily with a meal.    Dispense:  120 tablet    Refill:  4    Order Specific Question:   Supervising Provider    Answer:   Tresa Garter [id number]    Follow-up: Return in about 2 weeks (around 08/26/2016) for BP check  with Travia .   Alfonse Spruce FNP

## 2016-08-13 LAB — CMP14+EGFR
ALT: 26 IU/L (ref 0–32)
AST: 26 IU/L (ref 0–40)
Albumin/Globulin Ratio: 1.6 (ref 1.2–2.2)
Albumin: 4.6 g/dL (ref 3.5–5.5)
Alkaline Phosphatase: 63 IU/L (ref 39–117)
BUN/Creatinine Ratio: 12 (ref 9–23)
BUN: 13 mg/dL (ref 6–24)
Bilirubin Total: 0.3 mg/dL (ref 0.0–1.2)
CALCIUM: 9.7 mg/dL (ref 8.7–10.2)
CO2: 25 mmol/L (ref 20–29)
Chloride: 95 mmol/L — ABNORMAL LOW (ref 96–106)
Creatinine, Ser: 1.05 mg/dL — ABNORMAL HIGH (ref 0.57–1.00)
GFR, EST AFRICAN AMERICAN: 75 mL/min/{1.73_m2} (ref >59)
GFR, EST NON AFRICAN AMERICAN: 65 mL/min/{1.73_m2} (ref >59)
GLUCOSE: 164 mg/dL — AB (ref 65–99)
Globulin, Total: 2.8 g/dL (ref 1.5–4.5)
Potassium: 4.4 mmol/L (ref 3.5–5.2)
Sodium: 136 mmol/L (ref 134–144)
Total Protein: 7.4 g/dL (ref 6.0–8.5)

## 2016-08-13 LAB — LIPID PANEL
CHOL/HDL RATIO: 3.2 ratio (ref 0.0–4.4)
Cholesterol, Total: 214 mg/dL — ABNORMAL HIGH (ref 100–199)
HDL: 67 mg/dL (ref >39)
LDL Calculated: 113 mg/dL — ABNORMAL HIGH (ref 0–99)
Triglycerides: 172 mg/dL — ABNORMAL HIGH (ref 0–149)
VLDL CHOLESTEROL CAL: 34 mg/dL (ref 5–40)

## 2016-08-13 LAB — T4, FREE: Free T4: 0.17 ng/dL — ABNORMAL LOW (ref 0.82–1.77)

## 2016-08-13 LAB — TSH: TSH: 69.65 u[IU]/mL — AB (ref 0.450–4.500)

## 2016-08-17 ENCOUNTER — Other Ambulatory Visit: Payer: Self-pay | Admitting: Family Medicine

## 2016-08-20 ENCOUNTER — Other Ambulatory Visit: Payer: Self-pay | Admitting: Family Medicine

## 2016-08-23 ENCOUNTER — Telehealth: Payer: Self-pay

## 2016-08-23 ENCOUNTER — Other Ambulatory Visit: Payer: Self-pay | Admitting: Family Medicine

## 2016-08-23 DIAGNOSIS — E039 Hypothyroidism, unspecified: Secondary | ICD-10-CM

## 2016-08-23 DIAGNOSIS — E782 Mixed hyperlipidemia: Secondary | ICD-10-CM

## 2016-08-23 MED ORDER — ATORVASTATIN CALCIUM 20 MG PO TABS: 20.0000 mg | ORAL_TABLET | Freq: Every day | ORAL | 2 refills | Status: DC

## 2016-08-23 MED ORDER — LEVOTHYROXINE SODIUM 200 MCG PO TABS: 200.0000 ug | ORAL_TABLET | Freq: Every day | ORAL | 3 refills | Status: DC

## 2016-08-23 MED ORDER — LEVOTHYROXINE SODIUM 100 MCG PO TABS: 100.0000 ug | ORAL_TABLET | Freq: Every day | ORAL | 3 refills | Status: DC

## 2016-08-23 MED FILL — ?LEVOTHYROXINE 200 MCG TAB: 200 MCG | 90 days supply | Qty: 90 | Fill #0

## 2016-08-23 MED FILL — ?LEVOTHYROXINE 100 MCG TAB: 100 | 30 days supply | Qty: 30 | Fill #0

## 2016-08-23 MED FILL — ?ATORVASTATIN 20 MG TABLET: 20 | 30 days supply | Qty: 30 | Fill #0

## 2016-08-23 NOTE — Telephone Encounter (Signed)
CMA call regarding lab results   Patient did not answer but left a detailed message per DPR & to call back if have any questions  

## 2016-08-23 NOTE — Telephone Encounter (Signed)
-----   Message from Lizbeth BarkMandesia R Hairston, FNP sent at 08/23/2016 10:23 AM EDT ----- Thyroid function shows uncontrolled hypothyroidism. Your dose of levothyroxine will be increased.  Recommend follow up in 6 weeks. If levels still elevated recommend referral to endocrinology. Lipid levels were elevated. This can increase your risk of heart disease. Pravastatin will be discontinued. You will be prescribed atorvastatin to help lower risk.  Start eating a diet low in saturated fat. Limit your intake of fried foods, red meats, and whole milk. Increase physical activity. Creatinine levels elevated recommend increasing water intake and taking medications for blood pressure.

## 2016-08-31 ENCOUNTER — Ambulatory Visit: Payer: Self-pay | Attending: Family Medicine | Admitting: *Deleted

## 2016-08-31 VITALS — BP 120/83 | HR 70 | Temp 98.2°F | Resp 16 | Wt 158.8 lb

## 2016-08-31 DIAGNOSIS — I10 Essential (primary) hypertension: Secondary | ICD-10-CM | POA: Insufficient documentation

## 2016-08-31 NOTE — Progress Notes (Signed)
[facility] Pt alert and oriented and arrives in good spirits. Last OV 08/12/2016  with PCP.  Pt denies chest pain, SOB, HA, dizziness, or blurred vision.  Verified medication. Pt states medication was taken this morning.  Manual blood pressure reading: 120/83

## 2017-03-16 ENCOUNTER — Ambulatory Visit: Payer: Self-pay | Attending: Internal Medicine | Admitting: Physician Assistant

## 2017-03-16 VITALS — BP 171/85 | HR 85 | Temp 98.5°F | Resp 16 | Wt 165.4 lb

## 2017-03-16 DIAGNOSIS — E782 Mixed hyperlipidemia: Secondary | ICD-10-CM | POA: Insufficient documentation

## 2017-03-16 DIAGNOSIS — L292 Pruritus vulvae: Secondary | ICD-10-CM | POA: Insufficient documentation

## 2017-03-16 DIAGNOSIS — Z9114 Patient's other noncompliance with medication regimen: Secondary | ICD-10-CM | POA: Insufficient documentation

## 2017-03-16 DIAGNOSIS — I10 Essential (primary) hypertension: Secondary | ICD-10-CM | POA: Insufficient documentation

## 2017-03-16 DIAGNOSIS — E1110 Type 2 diabetes mellitus with ketoacidosis without coma: Secondary | ICD-10-CM | POA: Insufficient documentation

## 2017-03-16 DIAGNOSIS — E1165 Type 2 diabetes mellitus with hyperglycemia: Secondary | ICD-10-CM

## 2017-03-16 DIAGNOSIS — IMO0001 Reserved for inherently not codable concepts without codable children: Secondary | ICD-10-CM

## 2017-03-16 DIAGNOSIS — Z79899 Other long term (current) drug therapy: Secondary | ICD-10-CM | POA: Insufficient documentation

## 2017-03-16 DIAGNOSIS — Z79890 Hormone replacement therapy (postmenopausal): Secondary | ICD-10-CM | POA: Insufficient documentation

## 2017-03-16 DIAGNOSIS — E039 Hypothyroidism, unspecified: Secondary | ICD-10-CM | POA: Insufficient documentation

## 2017-03-16 DIAGNOSIS — N898 Other specified noninflammatory disorders of vagina: Secondary | ICD-10-CM

## 2017-03-16 DIAGNOSIS — Z794 Long term (current) use of insulin: Secondary | ICD-10-CM | POA: Insufficient documentation

## 2017-03-16 LAB — GLUCOSE, POCT (MANUAL RESULT ENTRY)
POC Glucose: 342 mg/dl — AB (ref 70–99)
POC Glucose: 320 mg/dl — AB (ref 70–99)

## 2017-03-16 MED ORDER — FLUCONAZOLE 150 MG PO TABS: ORAL_TABLET | ORAL | 0 refills | Status: DC

## 2017-03-16 MED ORDER — ATORVASTATIN CALCIUM 20 MG PO TABS: 20.0000 mg | ORAL_TABLET | Freq: Every day | ORAL | 2 refills | Status: DC

## 2017-03-16 MED ORDER — LEVOTHYROXINE SODIUM 200 MCG PO TABS: 200.0000 ug | ORAL_TABLET | Freq: Every day | ORAL | 3 refills | Status: DC

## 2017-03-16 MED ORDER — INSULIN ASPART 100 UNIT/ML ~~LOC~~ SOLN
20.0000 [IU] | Freq: Once | SUBCUTANEOUS | Status: AC
  Administered 2017-03-16: 20 [IU] via SUBCUTANEOUS

## 2017-03-16 MED ORDER — LEVOTHYROXINE SODIUM 100 MCG PO TABS: 100.0000 ug | ORAL_TABLET | Freq: Every day | ORAL | 3 refills | Status: DC

## 2017-03-16 MED ORDER — LISINOPRIL-HYDROCHLOROTHIAZIDE 20-25 MG PO TABS: 1.0000 | ORAL_TABLET | Freq: Every day | ORAL | 3 refills | Status: DC

## 2017-03-16 MED ORDER — INSULIN GLARGINE 100 UNIT/ML SOLOSTAR PEN: 25.0000 [IU] | PEN_INJECTOR | Freq: Every day | SUBCUTANEOUS | 3 refills | Status: DC

## 2017-03-16 MED ORDER — GLIPIZIDE 10 MG PO TABS: 10.0000 mg | ORAL_TABLET | Freq: Two times a day (BID) | ORAL | 3 refills | Status: DC

## 2017-03-16 MED ORDER — METFORMIN HCL ER 500 MG PO TB24: 1000.0000 mg | ORAL_TABLET | Freq: Two times a day (BID) | ORAL | 4 refills | Status: DC

## 2017-03-16 MED FILL — ?LEVOTHYROXINE 200 MCG TAB: 200 MCG | 30 days supply | Qty: 30 | Fill #0

## 2017-03-16 MED FILL — ATORVASTATIN 20 MG TABLET: 20 | 30 days supply | Qty: 30 | Fill #0

## 2017-03-16 MED FILL — !LANTUS SOLOSTAR 100UNITS/M: 100 | 24 days supply | Qty: 6 | Fill #0

## 2017-03-16 MED FILL — FLUCONAZOLE 150 MG TABLET: 150 | 5 days supply | Qty: 2 | Fill #0

## 2017-03-16 MED FILL — ?LEVOTHYROXINE 100 MCG TAB: 100 | 30 days supply | Qty: 30 | Fill #0

## 2017-03-16 MED FILL — LISINOPRIL-HCTZ 20-25 MG TA: 20-25 | 30 days supply | Qty: 30 | Fill #0

## 2017-03-16 MED FILL — glipiZIDE 10 MG TABS: 10 | 30 days supply | Qty: 60 | Fill #0

## 2017-03-16 MED FILL — METFORMIN HCL ER 500 MG TAB: 500 | 30 days supply | Qty: 120 | Fill #0

## 2017-03-16 NOTE — Patient Instructions (Signed)
Check blood sugars fasting and with meals and record and bring to your next visit. Take all meds as directed.  Check your blood pressure out of the office 2-3 times/week and record and bring to your next visit.

## 2017-03-16 NOTE — Progress Notes (Signed)
Patient ID: Ariana Cunningham, female   DOB: Jun 22, 1970, 47 y.o.   MRN: [id number]   Ariana Cunningham, is a 47 y.o. female  HER:[id number]  EHU:[id number]  DOB - 1970-09-15  Subjective:  Chief Complaint and HPI: [name] is a 47 y.o. female here today for several issues.  She has been having vaginal itching for about 2-3 weeks.  Denies STI risk factors.  No vaginal d/c or odor.  Has IUD in place.    Blood sugars out of control-running in 200s on a regular basis.  She is not compliant on meds or diet. Only takes meds as directed 70-80% of the time.    Taking thyroid and BP meds about 80-% of the time.  Hasn't taken BP meds in a few days.    ROS:   Constitutional:  No f/c, No night sweats, No unexplained weight loss. EENT:  No vision changes, No blurry vision, No hearing changes. No mouth, throat, or ear problems.  Respiratory: No cough, No SOB Cardiac: No CP, no palpitations GI:  No abd pain, No N/V/D. GU: No Urinary s/sx Musculoskeletal: No joint pain Neuro: No headache, no dizziness, no motor weakness.  Skin: No rash Endocrine:  No polydipsia. No polyuria.  Psych: Denies SI/HI  No problems updated.  ALLERGIES: No Known Allergies  PAST MEDICAL HISTORY: Past Medical History:  Diagnosis Date  . Diabetes mellitus   . Hyperlipidemia   . Thyroid condition     MEDICATIONS AT HOME: Prior to Admission medications   Medication Sig Start Date End Date Taking? Authorizing Provider  atorvastatin (LIPITOR) 20 MG tablet Take 1 tablet (20 mg total) by mouth daily. 03/16/17   Argentina Donovan, PA-C  Blood Glucose Monitoring Suppl (TRUE METRIX METER) w/Device KIT 1 each by Does not apply route as needed. 12/02/15   Maren Reamer, MD  fluconazole (DIFLUCAN) 150 MG tablet TAKE 1 TODAY AND 1 IN 5 DAYS for vaginal yeast infection 03/16/17   Argentina Donovan, PA-C  glipiZIDE (GLUCOTROL) 10 MG tablet Take 1 tablet (10 mg total) by mouth 2 (two) times daily before a meal. 03/16/17    Kaiel Weide, Dionne Bucy, PA-C  glucose blood (TRUE METRIX BLOOD GLUCOSE TEST) test strip 1 each by Other route 3 (three) times daily. 08/12/16   Alfonse Spruce, FNP  Insulin Glargine (LANTUS SOLOSTAR) 100 UNIT/ML Solostar Pen Inject 25 Units into the skin daily at 10 pm. 03/16/17   Argentina Donovan, PA-C  Insulin Pen Needle (ULTICARE MICRO PEN NEEDLES) 32G X 4 MM MISC 1 applicator by Does not apply route at bedtime. 08/12/16   Alfonse Spruce, FNP  Lancets Misc. (ACCU-CHEK MULTICLIX LANCET DEV) KIT 1 kit by Does not apply route 4 (four) times daily -  before meals and at bedtime. 11/23/12   Thurnell Lose, MD  levothyroxine (SYNTHROID, LEVOTHROID) 100 MCG tablet Take 1 tablet (100 mcg total) by mouth daily. 03/16/17   Argentina Donovan, PA-C  levothyroxine (SYNTHROID, LEVOTHROID) 200 MCG tablet Take 1 tablet (200 mcg total) by mouth daily. 03/16/17   Argentina Donovan, PA-C  lisinopril-hydrochlorothiazide (PRINZIDE,ZESTORETIC) 20-25 MG tablet Take 1 tablet by mouth daily. 03/16/17   Argentina Donovan, PA-C  metFORMIN (GLUCOPHAGE XR) 500 MG 24 hr tablet Take 2 tablets (1,000 mg total) by mouth 2 (two) times daily with a meal. 03/16/17   Lynnel Zanetti, Dionne Bucy, PA-C  TRUEPLUS LANCETS 28G MISC 1 each by Does not apply route 3 (three) times daily. 08/12/16  Alfonse Spruce, FNP  Vitamin D, Ergocalciferol, (DRISDOL) 50000 units CAPS capsule Take 1 capsule (50,000 Units total) by mouth every 7 (seven) days. 01/14/16   Maren Reamer, MD     Objective:  EXAM:   Vitals:   03/16/17 1017  BP: (!) 171/85  Pulse: 85  Resp: 16  Temp: 98.5 F (36.9 C)  TempSrc: Oral  SpO2: 98%  Weight: 165 lb 6.4 oz (75 kg)    General appearance : A&OX3. NAD. Non-toxic-appearing HEENT: Atraumatic and Normocephalic.  PERRLA. EOM intact.   Neck: supple, no JVD. No cervical lymphadenopathy. No thyromegaly Chest/Lungs:  Breathing-non-labored, Good air entry bilaterally, breath sounds normal without rales, rhonchi,  or wheezing  CVS: S1 S2 regular, no murmurs, gallops, rubs  GU:  External genitalia WNL.  Labia minora/vulva with some erythema.  Speculum inserted.  IUD strings in place.  Swabs taken.  Scant yellowish white D/c.  Bimanual unremarkable.   Extremities: Bilateral Lower Ext shows no edema, both legs are warm to touch with = pulse throughout Neurology:  CN II-XII grossly intact, Non focal.   Psych:  TP linear. J/I WNL. Normal speech. Appropriate eye contact and affect.  Skin:  No Rash  Data Review Lab Results  Component Value Date   HGBA1C 9.3 08/12/2016   HGBA1C 11.1 12/02/2015   HGBA1C 8.1 04/28/2015     Assessment & Plan   1. Vaginal itching - Cervicovaginal ancillary only - fluconazole (DIFLUCAN) 150 MG tablet; TAKE 1 TODAY AND 1 IN 5 DAYS for vaginal yeast infection  Dispense: 2 tablet; Refill: 0  2. DM (diabetes mellitus) type 2, uncontrolled, with ketoacidosis ([facility]) uncontrolled - Glucose (CBG) - Hemoglobin A1c - insulin aspart (novoLOG) injection 20 Units  3. Hypothyroidism, unspecified type - Thyroid Panel With TSH - levothyroxine (SYNTHROID, LEVOTHROID) 100 MCG tablet; Take 1 tablet (100 mcg total) by mouth daily.  Dispense: 90 tablet; Refill: 3 - levothyroxine (SYNTHROID, LEVOTHROID) 200 MCG tablet; Take 1 tablet (200 mcg total) by mouth daily.  Dispense: 90 tablet; Refill: 3  4. Uncontrolled type 2 diabetes mellitus without complication, with long-term current use of insulin ([facility])  - glipiZIDE (GLUCOTROL) 10 MG tablet; Take 1 tablet (10 mg total) by mouth 2 (two) times daily before a meal.  Dispense: 60 tablet; Refill: 3 - metFORMIN (GLUCOPHAGE XR) 500 MG 24 hr tablet; Take 2 tablets (1,000 mg total) by mouth 2 (two) times daily with a meal.  Dispense: 120 tablet; Refill: 4 Increase dose(she was only taking 20 units)- Insulin Glargine (LANTUS SOLOSTAR) 100 UNIT/ML Solostar Pen; Inject 25 Units into the skin daily at 10 pm.  Dispense: 30 mL; Refill: 3  5. Mixed  hyperlipidemia - atorvastatin (LIPITOR) 20 MG tablet; Take 1 tablet (20 mg total) by mouth daily.  Dispense: 30 tablet; Refill: 2  6. HTN (hypertension), benign Resume medications - lisinopril-hydrochlorothiazide (PRINZIDE,ZESTORETIC) 20-25 MG tablet; Take 1 tablet by mouth daily.  Dispense: 90 tablet; Refill: 3 - Comprehensive metabolic panel  Compliance with diet and all medications discussed at length.  Spent >80mns face to face and more than half was counseling on disease/medications/compliance/diet/exercise.    Patient have been counseled extensively about nutrition and exercise  Return in about 10 weeks (around 05/25/2017) for assign new PCP; follow-up DM, htn, thyroid.  The patient was given clear instructions to go to ER or return to medical center if symptoms don't improve, worsen or new problems develop. The patient verbalized understanding. The patient was told to call to get lab  results if they haven't heard anything in the next week.     Freeman Caldron, PA-C [facility] [place], [place]   03/16/2017, 10:38 AM

## 2017-03-17 ENCOUNTER — Telehealth: Payer: Self-pay

## 2017-03-17 LAB — COMPREHENSIVE METABOLIC PANEL
A/G RATIO: 1.6 (ref 1.2–2.2)
ALBUMIN: 4.5 g/dL (ref 3.5–5.5)
ALT: 20 IU/L (ref 0–32)
AST: 14 IU/L (ref 0–40)
Alkaline Phosphatase: 79 IU/L (ref 39–117)
BILIRUBIN TOTAL: 0.2 mg/dL (ref 0.0–1.2)
BUN / CREAT RATIO: 12 (ref 9–23)
BUN: 13 mg/dL (ref 6–24)
CHLORIDE: 98 mmol/L (ref 96–106)
CO2: 22 mmol/L (ref 20–29)
Calcium: 9.7 mg/dL (ref 8.7–10.2)
Creatinine, Ser: 1.06 mg/dL — ABNORMAL HIGH (ref 0.57–1.00)
GFR calc non Af Amer: 64 mL/min/{1.73_m2} (ref >59)
GFR, EST AFRICAN AMERICAN: 74 mL/min/{1.73_m2} (ref >59)
GLOBULIN, TOTAL: 2.8 g/dL (ref 1.5–4.5)
GLUCOSE: 316 mg/dL — AB (ref 65–99)
Potassium: 4.1 mmol/L (ref 3.5–5.2)
Sodium: 134 mmol/L (ref 134–144)
TOTAL PROTEIN: 7.3 g/dL (ref 6.0–8.5)

## 2017-03-17 LAB — HEMOGLOBIN A1C
Est. average glucose Bld gHb Est-mCnc: 358 mg/dL
Hgb A1c MFr Bld: 14.1 % — ABNORMAL HIGH (ref 4.8–5.6)

## 2017-03-17 LAB — CERVICOVAGINAL ANCILLARY ONLY
BACTERIAL VAGINITIS: POSITIVE — AB
CANDIDA VAGINITIS: POSITIVE — AB
Chlamydia: NEGATIVE
Neisseria Gonorrhea: NEGATIVE
Trichomonas: POSITIVE — AB

## 2017-03-17 LAB — THYROID PANEL WITH TSH
FREE THYROXINE INDEX: 0.6 — AB (ref 1.2–4.9)
T3 Uptake Ratio: 19 % — ABNORMAL LOW (ref 24–39)
T4, Total: 3.2 ug/dL — ABNORMAL LOW (ref 4.5–12.0)
TSH: 51.68 u[IU]/mL — ABNORMAL HIGH (ref 0.450–4.500)

## 2017-03-17 NOTE — Telephone Encounter (Signed)
Contacted pt to go over lab results pt is aware of results ad doesn't have any questions or concerns  Pt will be calling back this afternoon to inform me this 2 questions that I asked per Ariana CoAngela Cunningham  1) how often she is taking her thyroid medications and at what dose for the last 2-3 months?   2)Also exactly what dose and which diabetes medications she has been taking consistently over the last 2-3 months?

## 2017-03-18 ENCOUNTER — Other Ambulatory Visit: Payer: Self-pay | Admitting: Physician Assistant

## 2017-03-18 ENCOUNTER — Telehealth: Payer: Self-pay

## 2017-03-18 MED ORDER — METRONIDAZOLE 500 MG PO TABS: 500.0000 mg | ORAL_TABLET | Freq: Two times a day (BID) | ORAL | 0 refills | Status: DC

## 2017-03-18 NOTE — Telephone Encounter (Signed)
Contacted pt to go over culture pt didn't answer lvm asking pt to give me a call at her earliest convenience

## 2017-05-26 ENCOUNTER — Ambulatory Visit: Payer: Self-pay | Admitting: Internal Medicine

## 2017-06-24 ENCOUNTER — Ambulatory Visit: Payer: Self-pay | Attending: Internal Medicine | Admitting: Internal Medicine

## 2017-06-24 ENCOUNTER — Encounter: Payer: Self-pay | Admitting: Internal Medicine

## 2017-06-24 VITALS — BP 133/84 | HR 83 | Temp 98.8°F | Resp 16 | Wt 158.4 lb

## 2017-06-24 DIAGNOSIS — I10 Essential (primary) hypertension: Secondary | ICD-10-CM

## 2017-06-24 DIAGNOSIS — E785 Hyperlipidemia, unspecified: Secondary | ICD-10-CM | POA: Insufficient documentation

## 2017-06-24 DIAGNOSIS — E1165 Type 2 diabetes mellitus with hyperglycemia: Secondary | ICD-10-CM

## 2017-06-24 DIAGNOSIS — IMO0001 Reserved for inherently not codable concepts without codable children: Secondary | ICD-10-CM

## 2017-06-24 DIAGNOSIS — E1110 Type 2 diabetes mellitus with ketoacidosis without coma: Secondary | ICD-10-CM

## 2017-06-24 DIAGNOSIS — E559 Vitamin D deficiency, unspecified: Secondary | ICD-10-CM | POA: Insufficient documentation

## 2017-06-24 DIAGNOSIS — Z79890 Hormone replacement therapy (postmenopausal): Secondary | ICD-10-CM | POA: Insufficient documentation

## 2017-06-24 DIAGNOSIS — E039 Hypothyroidism, unspecified: Secondary | ICD-10-CM

## 2017-06-24 DIAGNOSIS — Z794 Long term (current) use of insulin: Secondary | ICD-10-CM | POA: Insufficient documentation

## 2017-06-24 DIAGNOSIS — Z79899 Other long term (current) drug therapy: Secondary | ICD-10-CM | POA: Insufficient documentation

## 2017-06-24 LAB — GLUCOSE, POCT (MANUAL RESULT ENTRY): POC Glucose: 244 mg/dl — AB (ref 70–99)

## 2017-06-24 LAB — POCT GLYCOSYLATED HEMOGLOBIN (HGB A1C): HbA1c, POC (controlled diabetic range): 11.9 % — AB (ref 0.0–7.0)

## 2017-06-24 NOTE — Patient Instructions (Addendum)
Increase Lantus to 24 units daily. Take Glipizide twice a day as prescribed.   Follow a Healthy Eating Plan - You can do it! Limit sugary drinks.  Avoid sodas, sweet tea, sport or energy drinks, or fruit drinks.  Drink water, lo-fat milk, or diet drinks. Limit snack foods.   Cut back on candy, cake, cookies, chips, ice cream.  These are a special treat, only in small amounts. Eat plenty of vegetables.  Especially dark green, red, and orange vegetables. Aim for at least 3 servings a day. More is better! Include fruit in your daily diet.  Whole fruit is much healthier than fruit juice! Limit "white" bread, "white" pasta, "white" rice.   Choose "100% whole grain" products, brown or wild rice. Avoid fatty meats. Try "Meatless Monday" and choose eggs or beans one day a week.  When eating meat, choose lean meats like chicken, [place], and fish.  Grill, broil, or bake meats instead of frying, and eat poultry without the skin. Eat less salt.  Avoid frozen pizzas, frozen dinners and salty foods.  Use seasonings other than salt in cooking.  This can help blood pressure and keep you from swelling Beer, wine and liquor have calories.  If you can safely drink alcohol, limit to 1 drink per day for women, 2 drinks for men

## 2017-06-24 NOTE — Addendum Note (Signed)
Addended by: Jonah BlueJOHNSON, Clint Biello B on: 06/24/2017 05:32 PM   Modules accepted: Orders

## 2017-06-24 NOTE — Progress Notes (Signed)
Patient ID: Ariana Cunningham, female    DOB: May 23, 1970  MRN: [id number]  CC: Follow-up   Subjective: Ariana Cunningham is a 47 y.o. female who presents for chronic disease management. Previous PCP was NP [place] Her concerns today include:  DM, HTN, hypothyroid, HL  [id number] last visit in February with the physician assistant, she reports that she has been taking her medications consistently. However on further questioning she is taking glipizide once a day instead of twice a day.she is also taking Lantus 20 units daily instead of 25.  she checks her blood sugars several times a week.she reports her morning blood sugar was in the 130s. Eating habits:  She is not sure what she should be doing in terms of her diet and wanted to have a discussion about that today. Exercise: She wears a Fitbit to keep track of her steps.  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  She walks up and down steps several times a day.  THyroid: TSH in February was 52.  She reports compliance with her medication levothyroxine.  She is requesting refill.  However she was not sure of the dose.  I see 2 doses on her med list -100 mcg and 200 mcg.   HL: Reports compliance with Lipitor.  Patient Active Problem List   Diagnosis Date Noted  . Vitamin D deficiency 01/13/2016  . IUD contraception 05/22/2015  . Encounter for removal and reinsertion of IUD 05/08/2015  . DM (diabetes mellitus) type 2, uncontrolled, with ketoacidosis ([place]) 08/23/2012  . HTN (hypertension), benign 08/23/2012  . Hypothyroidism 07/06/2006  . ANEMIA, IRON DEFICIENCY NOS 07/06/2006  . Microalbuminuria 07/06/2006  . HX, PNEUMONIA 07/06/2006     Current Outpatient Medications on File Prior to Visit  Medication Sig Dispense Refill  . atorvastatin (LIPITOR) 20 MG tablet Take 1 tablet (20 mg total) by mouth daily. 30 tablet 2  . Blood Glucose Monitoring Suppl (TRUE METRIX METER) w/Device KIT 1 each by Does not apply route as needed. 1  kit 0  . glipiZIDE (GLUCOTROL) 10 MG tablet Take 1 tablet (10 mg total) by mouth 2 (two) times daily before a meal. 60 tablet 3  . glucose blood (TRUE METRIX BLOOD GLUCOSE TEST) test strip 1 each by Other route 3 (three) times daily. 100 each 11  . Insulin Glargine (LANTUS SOLOSTAR) 100 UNIT/ML Solostar Pen Inject 25 Units into the skin daily at 10 pm. 30 mL 3  . Insulin Pen Needle (ULTICARE MICRO PEN NEEDLES) 32G X 4 MM MISC 1 applicator by Does not apply route at bedtime. 100 each 2  . Lancets Misc. (ACCU-CHEK MULTICLIX LANCET DEV) KIT 1 kit by Does not apply route 4 (four) times daily -  before meals and at bedtime. 1 each 12  . levothyroxine (SYNTHROID, LEVOTHROID) 100 MCG tablet Take 1 tablet (100 mcg total) by mouth daily. 90 tablet 3  . levothyroxine (SYNTHROID, LEVOTHROID) 200 MCG tablet Take 1 tablet (200 mcg total) by mouth daily. 90 tablet 3  . lisinopril-hydrochlorothiazide (PRINZIDE,ZESTORETIC) 20-25 MG tablet Take 1 tablet by mouth daily. 90 tablet 3  . metFORMIN (GLUCOPHAGE XR) 500 MG 24 hr tablet Take 2 tablets (1,000 mg total) by mouth 2 (two) times daily with a meal. 120 tablet 4  . TRUEPLUS LANCETS 28G MISC 1 each by Does not apply route 3 (three) times daily. 100 each 11  . Vitamin D, Ergocalciferol, (DRISDOL) 50000 units CAPS capsule Take 1 capsule (50,000 Units total) by  mouth every 7 (seven) days. 12 capsule 0   No current facility-administered medications on file prior to visit.     No Known Allergies  Social History   Socioeconomic History  . Marital status: Single    Spouse name: Not on file  . Number of children: Not on file  . Years of education: Not on file  . Highest education level: Not on file  Occupational History  . Not on file  Social Needs  . Financial resource strain: Not on file  . Food insecurity:    Worry: Not on file    Inability: Not on file  . Transportation needs:    Medical: Not on file    Non-medical: Not on file  Tobacco Use  .  Smoking status: Never Smoker  . Smokeless tobacco: Never Used  Substance and Sexual Activity  . Alcohol use: No  . Drug use: No  . Sexual activity: Yes    Partners: Male    Birth control/protection: IUD  Lifestyle  . Physical activity:    Days per week: Not on file    Minutes per session: Not on file  . Stress: Not on file  Relationships  . Social connections:    Talks on phone: Not on file    Gets together: Not on file    Attends religious service: Not on file    Active member of club or organization: Not on file    Attends meetings of clubs or organizations: Not on file    Relationship status: Not on file  . Intimate partner violence:    Fear of current or ex partner: Not on file    Emotionally abused: Not on file    Physically abused: Not on file    Forced sexual activity: Not on file  Other Topics Concern  . Not on file  Social History Narrative  . Not on file    No family history on file.  Past Surgical History:  Procedure Laterality Date  . CESAREAN SECTION      ROS: Review of Systems Negative except as stated above  PHYSICAL EXAM: BP 133/84   Pulse 83   Temp 98.8 F (37.1 C) (Oral)   Resp 16   Wt 158 lb 6.4 oz (71.8 kg)   SpO2 98%   BMI 26.36 kg/m   Physical Exam  General appearance - alert, well appearing, and in no distress Mental status - normal mood, behavior, speech, dress, motor activity, and thought processes Neck - supple, no significant adenopathy Chest - clear to auscultation, no wheezes, rales or rhonchi, symmetric air entry Heart - normal rate, regular rhythm, normal S1, S2, no murmurs, rubs, clicks or gallops Extremities - peripheral pulses normal, no pedal edema, no clubbing or cyanosis Diabetic Foot Exam - Simple   Simple Foot Form Visual Inspection No deformities, no ulcerations, no other skin breakdown bilaterally:  Yes Sensation Testing Intact to touch and monofilament testing bilaterally:  Yes Pulse Check Posterior Tibialis  and Dorsalis pulse intact bilaterally:  Yes Comments      Results for orders placed or performed in visit on 06/24/17  POCT glucose (manual entry)  Result Value Ref Range   POC Glucose 244 (A) 70 - 99 mg/dl  POCT glycosylated hemoglobin (Hb A1C)  Result Value Ref Range   Hemoglobin A1C  4.0 - 5.6 %   HbA1c, POC (prediabetic range)  5.7 - 6.4 %   HbA1c, POC (controlled diabetic range) 11.9 (A) 0.0 - 7.0 %  ASSESSMENT AND PLAN: 1. Diabetes mellitus type 2, uncontrolled, without complications ([place]) -I had a detailed discussion with her about healthy eating habits including avoiding sugary drinks, limiting white carbohydrates and incorporating fresh fruits and vegetables.  I also recommend cutting back on red meat and eating more white meat. Continue to stay active. Increase Lantus to 24 units daily.  She will take glipizide twice a day as prescribed. - POCT glucose (manual entry) - POCT glycosylated hemoglobin (Hb A1C) - CBC  2. Hypothyroidism, unspecified type I called the pharmacy and contrary to what the patient had told me she last refilled the prescription for levothyroxine 3 months ago.  I told them to fill the levothyroxine 200 mcg only for now.  I do not want to put her on 300 mcg if she was not taking this amount consistently to begin with - TSH  3. Essential hypertension Continue lisinopril/HCTZ.  Low-salt diet.   Patient was given the opportunity to ask questions.  Patient verbalized understanding of the plan and was able to repeat key elements of the plan.   Orders Placed This Encounter  Procedures  . TSH  . CBC  . POCT glucose (manual entry)  . POCT glycosylated hemoglobin (Hb A1C)     Requested Prescriptions    No prescriptions requested or ordered in this encounter    Return in about 3 months (around 09/24/2017).  Karle Plumber, MD, FACP

## 2017-06-25 LAB — CBC
HEMATOCRIT: 33.3 % — AB (ref 34.0–46.6)
Hemoglobin: 11.3 g/dL (ref 11.1–15.9)
MCH: 32.3 pg (ref 26.6–33.0)
MCHC: 33.9 g/dL (ref 31.5–35.7)
MCV: 95 fL (ref 79–97)
PLATELETS: 345 10*3/uL (ref 150–450)
RBC: 3.5 x10E6/uL — ABNORMAL LOW (ref 3.77–5.28)
RDW: 13.2 % (ref 12.3–15.4)
WBC: 7 10*3/uL (ref 3.4–10.8)

## 2017-06-25 LAB — TSH: TSH: 5.25 u[IU]/mL — ABNORMAL HIGH (ref 0.450–4.500)

## 2017-06-26 ENCOUNTER — Telehealth: Payer: Self-pay | Admitting: Internal Medicine

## 2017-06-26 NOTE — Telephone Encounter (Signed)
PC placed to pt this a.m to go over lab results and to clarify whether she was taking the Levothyroxine up to the time I saw her 2 days ago.  I got an automated voice mail.  I did not leave a message.  Will have CMA try to reach her this wk.  Results for orders placed or performed in visit on 06/24/17  TSH  Result Value Ref Range   TSH 5.250 (H) 0.450 - 4.500 uIU/mL  CBC  Result Value Ref Range   WBC 7.0 3.4 - 10.8 x10E3/uL   RBC 3.50 (L) 3.77 - 5.28 x10E6/uL   Hemoglobin 11.3 11.1 - 15.9 g/dL   Hematocrit 16.133.3 (L) 09.634.0 - 46.6 %   MCV 95 79 - 97 fL   MCH 32.3 26.6 - 33.0 pg   MCHC 33.9 31.5 - 35.7 g/dL   RDW 04.513.2 40.912.3 - 81.115.4 %   Platelets 345 150 - 450 x10E3/uL  POCT glucose (manual entry)  Result Value Ref Range   POC Glucose 244 (A) 70 - 99 mg/dl  POCT glycosylated hemoglobin (Hb A1C)  Result Value Ref Range   Hemoglobin A1C  4.0 - 5.6 %   HbA1c, POC (prediabetic range)  5.7 - 6.4 %   HbA1c, POC (controlled diabetic range) 11.9 (A) 0.0 - 7.0 %

## 2017-06-29 ENCOUNTER — Telehealth: Payer: Self-pay | Admitting: Internal Medicine

## 2017-06-29 ENCOUNTER — Telehealth: Payer: Self-pay

## 2017-06-29 NOTE — Telephone Encounter (Signed)
Contacted pt to go over lab results pt didn't answer lvm asking pt to give me a call at her earliest convenience  

## 2017-06-29 NOTE — Telephone Encounter (Signed)
Patient called to let her nurse know her levothyroxine are 200mg  and 100mg . Please f/u

## 2017-06-29 NOTE — Telephone Encounter (Signed)
Pt returned call I went over lab results with pt. Pt states she was taking 2 different doses of thyroid medication but she was driving at the time and she will be home by 330pm and she will give me a call back to let me know the dose

## 2017-06-30 MED ORDER — LEVOTHYROXINE SODIUM 100 MCG PO TABS
100.0000 ug | ORAL_TABLET | Freq: Every day | ORAL | 3 refills | Status: DC
Start: 2017-06-30 — End: 2017-12-21

## 2017-06-30 NOTE — Telephone Encounter (Signed)
Will forward to pcp

## 2017-07-01 NOTE — Telephone Encounter (Signed)
Contacted pt to make her ware of DR. Johnson left a detailed vm

## 2017-07-22 ENCOUNTER — Ambulatory Visit: Payer: Self-pay | Attending: Internal Medicine | Admitting: Internal Medicine

## 2017-07-22 VITALS — BP 133/81 | HR 71 | Temp 98.4°F | Resp 16 | Wt 155.6 lb

## 2017-07-22 DIAGNOSIS — Z1231 Encounter for screening mammogram for malignant neoplasm of breast: Secondary | ICD-10-CM

## 2017-07-22 DIAGNOSIS — D509 Iron deficiency anemia, unspecified: Secondary | ICD-10-CM | POA: Insufficient documentation

## 2017-07-22 DIAGNOSIS — Z794 Long term (current) use of insulin: Secondary | ICD-10-CM | POA: Insufficient documentation

## 2017-07-22 DIAGNOSIS — I10 Essential (primary) hypertension: Secondary | ICD-10-CM | POA: Insufficient documentation

## 2017-07-22 DIAGNOSIS — Z9889 Other specified postprocedural states: Secondary | ICD-10-CM | POA: Insufficient documentation

## 2017-07-22 DIAGNOSIS — Z79890 Hormone replacement therapy (postmenopausal): Secondary | ICD-10-CM | POA: Insufficient documentation

## 2017-07-22 DIAGNOSIS — E1110 Type 2 diabetes mellitus with ketoacidosis without coma: Secondary | ICD-10-CM | POA: Insufficient documentation

## 2017-07-22 DIAGNOSIS — E039 Hypothyroidism, unspecified: Secondary | ICD-10-CM | POA: Insufficient documentation

## 2017-07-22 DIAGNOSIS — Z7901 Long term (current) use of anticoagulants: Secondary | ICD-10-CM | POA: Insufficient documentation

## 2017-07-22 DIAGNOSIS — E559 Vitamin D deficiency, unspecified: Secondary | ICD-10-CM | POA: Insufficient documentation

## 2017-07-22 DIAGNOSIS — Z7984 Long term (current) use of oral hypoglycemic drugs: Secondary | ICD-10-CM | POA: Insufficient documentation

## 2017-07-22 DIAGNOSIS — N760 Acute vaginitis: Secondary | ICD-10-CM | POA: Insufficient documentation

## 2017-07-22 DIAGNOSIS — Z124 Encounter for screening for malignant neoplasm of cervix: Secondary | ICD-10-CM | POA: Insufficient documentation

## 2017-07-22 DIAGNOSIS — Z975 Presence of (intrauterine) contraceptive device: Secondary | ICD-10-CM | POA: Insufficient documentation

## 2017-07-22 DIAGNOSIS — Z79899 Other long term (current) drug therapy: Secondary | ICD-10-CM | POA: Insufficient documentation

## 2017-07-22 DIAGNOSIS — Z1239 Encounter for other screening for malignant neoplasm of breast: Secondary | ICD-10-CM | POA: Insufficient documentation

## 2017-07-22 MED ORDER — FLUCONAZOLE 150 MG PO TABS: 150.0000 mg | ORAL_TABLET | Freq: Once | ORAL | 0 refills | Status: AC

## 2017-07-22 MED FILL — FLUCONAZOLE 150 MG TABS: 150 | 1 days supply | Qty: 1 | Fill #0

## 2017-07-22 MED FILL — ?LEVOTHYROXINE 100 MCG TAB: 100 | 30 days supply | Qty: 30 | Fill #0

## 2017-07-22 NOTE — Progress Notes (Signed)
Patient ID: Ariana Cunningham, female    DOB: 1970-10-21  MRN: [id number]  CC: Vaginal Itching   Subjective: Ariana Cunningham is a 47 y.o. female who presents for urgent care visit Her concerns today include:   Patient complains of vaginal itching times several days.  No vaginal discharge.  She is sexually active with one partner. She is overdue for Pap smear. No abnormal Paps in the past. She has Mirena IUD in place for birth control.  She has not had a menstrual cycle in a few years since having the IUD but had a menstrual cycle for 4 days last week. Patient Active Problem List   Diagnosis Date Noted  . Vitamin D deficiency 01/13/2016  . IUD contraception 05/22/2015  . Encounter for removal and reinsertion of IUD 05/08/2015  . DM (diabetes mellitus) type 2, uncontrolled, with ketoacidosis ([place]) 08/23/2012  . HTN (hypertension), benign 08/23/2012  . Hypothyroidism 07/06/2006  . ANEMIA, IRON DEFICIENCY NOS 07/06/2006  . Microalbuminuria 07/06/2006  . HX, PNEUMONIA 07/06/2006     Current Outpatient Medications on File Prior to Visit  Medication Sig Dispense Refill  . atorvastatin (LIPITOR) 20 MG tablet Take 1 tablet (20 mg total) by mouth daily. 30 tablet 2  . Blood Glucose Monitoring Suppl (TRUE METRIX METER) w/Device KIT 1 each by Does not apply route as needed. 1 kit 0  . glipiZIDE (GLUCOTROL) 10 MG tablet Take 1 tablet (10 mg total) by mouth 2 (two) times daily before a meal. 60 tablet 3  . glucose blood (TRUE METRIX BLOOD GLUCOSE TEST) test strip 1 each by Other route 3 (three) times daily. 100 each 11  . Insulin Glargine (LANTUS SOLOSTAR) 100 UNIT/ML Solostar Pen Inject 25 Units into the skin daily at 10 pm. 30 mL 3  . Insulin Pen Needle (ULTICARE MICRO PEN NEEDLES) 32G X 4 MM MISC 1 applicator by Does not apply route at bedtime. 100 each 2  . Lancets Misc. (ACCU-CHEK MULTICLIX LANCET DEV) KIT 1 kit by Does not apply route 4 (four) times daily -  before meals and at bedtime. 1  each 12  . levothyroxine (SYNTHROID, LEVOTHROID) 100 MCG tablet Take 1 tablet (100 mcg total) by mouth daily. Take with her 200 MCG tablet daily 90 tablet 3  . levothyroxine (SYNTHROID, LEVOTHROID) 200 MCG tablet Take 1 tablet (200 mcg total) by mouth daily. 90 tablet 3  . lisinopril-hydrochlorothiazide (PRINZIDE,ZESTORETIC) 20-25 MG tablet Take 1 tablet by mouth daily. 90 tablet 3  . metFORMIN (GLUCOPHAGE XR) 500 MG 24 hr tablet Take 2 tablets (1,000 mg total) by mouth 2 (two) times daily with a meal. 120 tablet 4  . TRUEPLUS LANCETS 28G MISC 1 each by Does not apply route 3 (three) times daily. 100 each 11  . Vitamin D, Ergocalciferol, (DRISDOL) 50000 units CAPS capsule Take 1 capsule (50,000 Units total) by mouth every 7 (seven) days. 12 capsule 0   No current facility-administered medications on file prior to visit.     No Known Allergies  Social History   Socioeconomic History  . Marital status: Single    Spouse name: Not on file  . Number of children: Not on file  . Years of education: Not on file  . Highest education level: Not on file  Occupational History  . Not on file  Social Needs  . Financial resource strain: Not on file  . Food insecurity:    Worry: Not on file    Inability: Not on file  .  Transportation needs:    Medical: Not on file    Non-medical: Not on file  Tobacco Use  . Smoking status: Never Smoker  . Smokeless tobacco: Never Used  Substance and Sexual Activity  . Alcohol use: No  . Drug use: No  . Sexual activity: Yes    Partners: Male    Birth control/protection: IUD  Lifestyle  . Physical activity:    Days per week: Not on file    Minutes per session: Not on file  . Stress: Not on file  Relationships  . Social connections:    Talks on phone: Not on file    Gets together: Not on file    Attends religious service: Not on file    Active member of club or organization: Not on file    Attends meetings of clubs or organizations: Not on file     Relationship status: Not on file  . Intimate partner violence:    Fear of current or ex partner: Not on file    Emotionally abused: Not on file    Physically abused: Not on file    Forced sexual activity: Not on file  Other Topics Concern  . Not on file  Social History Narrative  . Not on file    No family history on file.  Past Surgical History:  Procedure Laterality Date  . CESAREAN SECTION      ROS: Review of Systems Negative except as stated above PHYSICAL EXAM: BP 133/81   Pulse 71   Temp 98.4 F (36.9 C) (Oral)   Resp 16   Wt 155 lb 9.6 oz (70.6 kg)   SpO2 97%   BMI 25.89 kg/m   Physical Exam General appearance - alert, well appearing, and in no distress Pelvic - CMA [place]ock present: normal external genitalia, vulva, vagina, cervix, uterus and adnexa  ASSESSMENT AND PLAN: 1. Acute vaginitis We will treat empirically for yeast. - fluconazole (DIFLUCAN) 150 MG tablet; Take 1 tablet (150 mg total) by mouth once for 1 dose.  Dispense: 1 tablet; Refill: 0  2. Pap smear for cervical cancer screening - Cytology - PAP In regards to the menstrual cycle that she had last week, I advised that we will observe for now as sometimes you can still get a cycle with the Mirena IUD.  She will let me know if she has any further vaginal bleeding 3. Breast cancer screening - MM Digital Screening; Future   Patient was given the opportunity to ask questions.  Patient verbalized understanding of the plan and was able to repeat key elements of the plan.   Orders Placed This Encounter  Procedures  . MM Digital Screening     Requested Prescriptions   Signed Prescriptions Disp Refills  . fluconazole (DIFLUCAN) 150 MG tablet 1 tablet 0    Sig: Take 1 tablet (150 mg total) by mouth once for 1 dose.    No follow-ups on file.  Karle Plumber, MD, FACP

## 2017-07-22 NOTE — Progress Notes (Signed)
Pt denies discharge

## 2017-07-26 ENCOUNTER — Other Ambulatory Visit: Payer: Self-pay | Admitting: Internal Medicine

## 2017-07-26 LAB — CYTOLOGY - PAP
Bacterial vaginitis: POSITIVE — AB
Candida vaginitis: NEGATIVE
Chlamydia: NEGATIVE
Diagnosis: NEGATIVE
HPV: NOT DETECTED
Neisseria Gonorrhea: NEGATIVE
Trichomonas: POSITIVE — AB

## 2017-07-26 MED ORDER — METRONIDAZOLE 500 MG PO TABS: 500.0000 mg | ORAL_TABLET | Freq: Two times a day (BID) | ORAL | 0 refills | Status: DC

## 2017-07-27 ENCOUNTER — Telehealth: Payer: Self-pay

## 2017-07-27 MED FILL — metroNIDAZOLE 500 MG TABS: 500 | 7 days supply | Qty: 14 | Fill #0

## 2017-07-27 NOTE — Telephone Encounter (Signed)
Contacted pt to go over pap results pt didn't answer lvm asking pt to give me a call at her earliest convenience  

## 2017-07-29 ENCOUNTER — Telehealth: Payer: Self-pay | Admitting: Internal Medicine

## 2017-07-29 NOTE — Telephone Encounter (Signed)
Patient was informed and verbalized understanding of her pap results.

## 2017-08-25 ENCOUNTER — Ambulatory Visit: Payer: Self-pay | Attending: Internal Medicine | Admitting: Physician Assistant

## 2017-08-25 VITALS — BP 128/71 | HR 76 | Temp 98.7°F | Resp 18 | Ht 64.0 in | Wt 152.0 lb

## 2017-08-25 DIAGNOSIS — E785 Hyperlipidemia, unspecified: Secondary | ICD-10-CM | POA: Insufficient documentation

## 2017-08-25 DIAGNOSIS — I10 Essential (primary) hypertension: Secondary | ICD-10-CM | POA: Insufficient documentation

## 2017-08-25 DIAGNOSIS — N898 Other specified noninflammatory disorders of vagina: Secondary | ICD-10-CM

## 2017-08-25 DIAGNOSIS — E1110 Type 2 diabetes mellitus with ketoacidosis without coma: Secondary | ICD-10-CM | POA: Insufficient documentation

## 2017-08-25 DIAGNOSIS — Z79899 Other long term (current) drug therapy: Secondary | ICD-10-CM | POA: Insufficient documentation

## 2017-08-25 DIAGNOSIS — Z8619 Personal history of other infectious and parasitic diseases: Secondary | ICD-10-CM

## 2017-08-25 DIAGNOSIS — Z794 Long term (current) use of insulin: Secondary | ICD-10-CM | POA: Insufficient documentation

## 2017-08-25 DIAGNOSIS — Z79890 Hormone replacement therapy (postmenopausal): Secondary | ICD-10-CM | POA: Insufficient documentation

## 2017-08-25 DIAGNOSIS — Z113 Encounter for screening for infections with a predominantly sexual mode of transmission: Secondary | ICD-10-CM

## 2017-08-25 LAB — POCT URINALYSIS DIPSTICK
BILIRUBIN UA: NEGATIVE
Glucose, UA: POSITIVE — AB
Leukocytes, UA: NEGATIVE
NITRITE UA: NEGATIVE
Protein, UA: POSITIVE — AB
SPEC GRAV UA: 1.015 (ref 1.010–1.025)
UROBILINOGEN UA: 0.2 U/dL
pH, UA: 5.5 (ref 5.0–8.0)

## 2017-08-25 LAB — GLUCOSE, POCT (MANUAL RESULT ENTRY)
POC Glucose: 304 mg/dl — AB (ref 70–99)
POC Glucose: 319 mg/dl — AB (ref 70–99)

## 2017-08-25 MED ORDER — INSULIN ASPART 100 UNIT/ML ~~LOC~~ SOLN
20.0000 [IU] | Freq: Once | SUBCUTANEOUS | Status: AC
  Administered 2017-08-25: 20 [IU] via SUBCUTANEOUS

## 2017-08-25 MED ORDER — FLUCONAZOLE 150 MG PO TABS: 150.0000 mg | ORAL_TABLET | Freq: Once | ORAL | 0 refills | Status: AC

## 2017-08-25 MED FILL — FLUCONAZOLE 150 MG TABS: 150 | 2 days supply | Qty: 2 | Fill #0

## 2017-08-25 NOTE — Patient Instructions (Signed)
Check your blood sugars fasting and at bedtime and record and bring to next visit.   Diabetes Mellitus and Nutrition When you have diabetes (diabetes mellitus), it is very important to have healthy eating habits because your blood sugar (glucose) levels are greatly affected by what you eat and drink. Eating healthy foods in the appropriate amounts, at about the same times every day, can help you:  Control your blood glucose.  Lower your risk of heart disease.  Improve your blood pressure.  Reach or maintain a healthy weight.  Every person with diabetes is different, and each person has different needs for a meal plan. Your health care provider may recommend that you work with a diet and nutrition specialist (dietitian) to make a meal plan that is best for you. Your meal plan may vary depending on factors such as:  The calories you need.  The medicines you take.  Your weight.  Your blood glucose, blood pressure, and cholesterol levels.  Your activity level.  Other health conditions you have, such as heart or kidney disease.  How do carbohydrates affect me? Carbohydrates affect your blood glucose level more than any other type of food. Eating carbohydrates naturally increases the amount of glucose in your blood. Carbohydrate counting is a method for keeping track of how many carbohydrates you eat. Counting carbohydrates is important to keep your blood glucose at a healthy level, especially if you use insulin or take certain oral diabetes medicines. It is important to know how many carbohydrates you can safely have in each meal. This is different for every person. Your dietitian can help you calculate how many carbohydrates you should have at each meal and for snack. Foods that contain carbohydrates include:  Bread, cereal, rice, pasta, and crackers.  Potatoes and corn.  Peas, beans, and lentils.  Milk and yogurt.  Fruit and juice.  Desserts, such as cakes, cookies, ice cream,  and candy.  How does alcohol affect me? Alcohol can cause a sudden decrease in blood glucose (hypoglycemia), especially if you use insulin or take certain oral diabetes medicines. Hypoglycemia can be a life-threatening condition. Symptoms of hypoglycemia (sleepiness, dizziness, and confusion) are similar to symptoms of having too much alcohol. If your health care provider says that alcohol is safe for you, follow these guidelines:  Limit alcohol intake to no more than 1 drink per day for nonpregnant women and 2 drinks per day for men. One drink equals 12 oz of beer, 5 oz of wine, or 1 oz of hard liquor.  Do not drink on an empty stomach.  Keep yourself hydrated with water, diet soda, or unsweetened iced tea.  Keep in mind that regular soda, juice, and other mixers may contain a lot of sugar and must be counted as carbohydrates.  What are tips for following this plan? Reading food labels  Start by checking the serving size on the label. The amount of calories, carbohydrates, fats, and other nutrients listed on the label are based on one serving of the food. Many foods contain more than one serving per package.  Check the total grams (g) of carbohydrates in one serving. You can calculate the number of servings of carbohydrates in one serving by dividing the total carbohydrates by 15. For example, if a food has 30 g of total carbohydrates, it would be equal to 2 servings of carbohydrates.  Check the number of grams (g) of saturated and trans fats in one serving. Choose foods that have low or no amount  of these fats.  Check the number of milligrams (mg) of sodium in one serving. Most people should limit total sodium intake to less than 2,300 mg per day.  Always check the nutrition information of foods labeled as "low-fat" or "nonfat". These foods may be higher in added sugar or refined carbohydrates and should be avoided.  Talk to your dietitian to identify your daily goals for nutrients  listed on the label. Shopping  Avoid buying canned, premade, or processed foods. These foods tend to be high in fat, sodium, and added sugar.  Shop around the outside edge of the grocery store. This includes fresh fruits and vegetables, bulk grains, fresh meats, and fresh dairy. Cooking  Use low-heat cooking methods, such as baking, instead of high-heat cooking methods like deep frying.  Cook using healthy oils, such as olive, canola, or sunflower oil.  Avoid cooking with butter, cream, or high-fat meats. Meal planning  Eat meals and snacks regularly, preferably at the same times every day. Avoid going long periods of time without eating.  Eat foods high in fiber, such as fresh fruits, vegetables, beans, and whole grains. Talk to your dietitian about how many servings of carbohydrates you can eat at each meal.  Eat 4-6 ounces of lean protein each day, such as lean meat, chicken, fish, eggs, or tofu. 1 ounce is equal to 1 ounce of meat, chicken, or fish, 1 egg, or 1/4 cup of tofu.  Eat some foods each day that contain healthy fats, such as avocado, nuts, seeds, and fish. Lifestyle   Check your blood glucose regularly.  Exercise at least 30 minutes 5 or more days each week, or as told by your health care provider.  Take medicines as told by your health care provider.  Do not use any products that contain nicotine or tobacco, such as cigarettes and e-cigarettes. If you need help quitting, ask your health care provider.  Work with a Social worker or diabetes educator to identify strategies to manage stress and any emotional and social challenges. What are some questions to ask my health care provider?  Do I need to meet with a diabetes educator?  Do I need to meet with a dietitian?  What number can I call if I have questions?  When are the best times to check my blood glucose? Where to find more information:  American Diabetes Association:  diabetes.org/food-and-fitness/food  Academy of Nutrition and Dietetics: PokerClues.dk  [employer] of Diabetes and Digestive and Kidney Diseases (NIH): ContactWire.be Summary  A healthy meal plan will help you control your blood glucose and maintain a healthy lifestyle.  Working with a diet and nutrition specialist (dietitian) can help you make a meal plan that is best for you.  Keep in mind that carbohydrates and alcohol have immediate effects on your blood glucose levels. It is important to count carbohydrates and to use alcohol carefully. This information is not intended to replace advice given to you by your health care provider. Make sure you discuss any questions you have with your health care provider. Document Released: 10/08/2004 Document Revised: 02/16/2016 Document Reviewed: 02/16/2016 Elsevier Interactive Patient Education  [employer].

## 2017-08-25 NOTE — Progress Notes (Signed)
Patient ID: Ariana Cunningham, female   DOB: [birth year], 47 y.o.   MRN: [id number]   Ariana Cunningham, is a 47 y.o. female  [id number]:[id number]  [id number]  DOB - 10-25-1970  Subjective:  Chief Complaint and HPI: Ariana Cunningham is a 47 y.o. female here today for vaginal itching and whitish and clumpy discharge.  +recent antibiotics for trich.  Blood sugars not controlled.  Not checking sugar at home.  Didn'Cunningham take Lantus last night.  No pelvic pain.  No f/c.  No N/V.  Ate a candy bar just before coming to the office.  Finished all antibiotics for trich.  Partner was treated.     ROS:   Constitutional:  No f/c, No night sweats, No unexplained weight loss. EENT:  No vision changes, No blurry vision, No hearing changes. No mouth, throat, or ear problems.  Respiratory: No cough, No SOB Cardiac: No CP, no palpitations GI:  No abd pain, No N/V/D. GU: No Urinary s/sx Musculoskeletal: No joint pain Neuro: No headache, no dizziness, no motor weakness.  Skin: No rash Endocrine:  No polydipsia. No polyuria.  Psych: Denies SI/HI  No problems updated.  ALLERGIES: No Known Allergies  PAST MEDICAL HISTORY: Past Medical History:  Diagnosis Date  . Diabetes mellitus   . Hyperlipidemia   . Thyroid condition     MEDICATIONS AT HOME: Prior to Admission medications   Medication Sig Start Date End Date Taking? Authorizing Provider  atorvastatin (LIPITOR) 20 MG tablet Take 1 tablet (20 mg total) by mouth daily. 03/16/17  Yes McClung, Angela M, PA-C  Blood Glucose Monitoring Suppl (TRUE METRIX METER) w/Device KIT 1 each by Does not apply route as needed. 12/02/15  Yes Langeland, Dawn T, MD  glipiZIDE (GLUCOTROL) 10 MG tablet Take 1 tablet (10 mg total) by mouth 2 (two) times daily before a meal. 03/16/17  Yes McClung, Angela M, PA-C  glucose blood (TRUE METRIX BLOOD GLUCOSE TEST) test strip 1 each by Other route 3 (three) times daily. 08/12/16  Yes Ariana Spruce, FNP  Insulin Glargine (LANTUS  SOLOSTAR) 100 UNIT/ML Solostar Pen Inject 25 Units into the skin daily at 10 pm. 03/16/17  Yes McClung, Angela M, PA-C  Insulin Pen Needle (ULTICARE MICRO PEN NEEDLES) 32G X 4 MM MISC 1 applicator by Does not apply route at bedtime. 08/12/16  Yes Hairston, Maylon Peppers, FNP  Lancets Misc. (ACCU-CHEK MULTICLIX LANCET DEV) KIT 1 kit by Does not apply route 4 (four) times daily -  before meals and at bedtime. 11/23/12  Yes Ariana Lose, MD  levothyroxine (SYNTHROID, LEVOTHROID) 100 MCG tablet Take 1 tablet (100 mcg total) by mouth daily. Take with her 200 MCG tablet daily 06/30/17  Yes Ariana Pier, MD  levothyroxine (SYNTHROID, LEVOTHROID) 200 MCG tablet Take 1 tablet (200 mcg total) by mouth daily. 03/16/17  Yes Ariana Cunningham M, PA-C  fluconazole (DIFLUCAN) 150 MG tablet Take 1 tablet (150 mg total) by mouth once for 1 dose. Repeat in 5 days if needed 08/25/17 08/25/17  Ariana Donovan, PA-C  lisinopril-hydrochlorothiazide (PRINZIDE,ZESTORETIC) 20-25 MG tablet Take 1 tablet by mouth daily. 03/16/17   Ariana Donovan, PA-C  metFORMIN (GLUCOPHAGE XR) 500 MG 24 hr tablet Take 2 tablets (1,000 mg total) by mouth 2 (two) times daily with a meal. 03/16/17   McClung, Dionne Bucy, PA-C  TRUEPLUS LANCETS 28G MISC 1 each by Does not apply route 3 (three) times daily. 08/12/16   Ariana Spruce, FNP  Vitamin D, Ergocalciferol, (DRISDOL)  50000 units CAPS capsule Take 1 capsule (50,000 Units total) by mouth every 7 (seven) days. 01/14/16   Maren Reamer, MD     Objective:  EXAM:   Vitals:   08/25/17 1057  BP: 128/71  Pulse: 76  Resp: 18  Temp: 98.7 F (37.1 C)  TempSrc: Oral  SpO2: 100%  Weight: 152 lb (68.9 kg)  Height: 5' 4" (1.626 Cunningham)    General appearance : A&OX3. NAD. Non-toxic-appearing HEENT: Atraumatic and Normocephalic.  PERRLA. EOM intact.   Neck: supple, no JVD. No cervical lymphadenopathy. No thyromegaly Chest/Lungs:  Breathing-non-labored, Good air entry bilaterally, breath  sounds normal without rales, rhonchi, or wheezing  CVS: S1 S2 regular, no murmurs, gallops, rubs  Extremities: Bilateral Lower Ext shows no edema, both legs are warm to touch with = pulse throughout Neurology:  CN II-XII grossly intact, Non focal.   Psych:  TP linear. J/I WNL. Normal speech. Appropriate eye contact and affect.  Skin:  No Rash  Data Review Lab Results  Component Value Date   HGBA1C 11.9 (A) 06/24/2017   HGBA1C 14.1 (H) 03/16/2017   HGBA1C 9.3 08/12/2016     Assessment & Plan   1. H/O trichomonal vaginitis - Urine cytology ancillary only - HIV antibody (with reflex) - RPR  2. DM (diabetes mellitus) type 2, uncontrolled, with ketoacidosis ([place]) Uncontrolled and poor compliance.  Compliance and eliminating sugar from diet are imperative.   - Glucose (CBG) - Urinalysis Dipstick - insulin aspart (novoLOG) injection 20 Units plus 20 ounces water given po.    3. Essential hypertension Controlled-continue current regimen  4. Vaginal itching -will assume yeast due to recent antibiotics and uncontrolled blood sugar and retest for other - Urine cytology ancillary only - fluconazole (DIFLUCAN) 150 MG tablet; Take 1 tablet (150 mg total) by mouth once for 1 dose. Repeat in 5 days if needed  Dispense: 2 tablet; Refill: 0  5.  STD screening due to recent +trich      Patient have been counseled extensively about nutrition and exercise  Return in about 1 month (around 09/25/2017) for Dr Wynetta Emery for DM and htn/fasting labs.  .  The patient was given clear instructions to go to ER or return to medical center if symptoms don'Cunningham improve, worsen or new problems develop. The patient verbalized understanding. The patient was told to call to get lab results if they haven'Cunningham heard anything in the next week.     Ariana Caldron, PA-C [facility] [place], [place]   08/25/2017, 11:06 AM

## 2017-08-26 LAB — URINE CYTOLOGY ANCILLARY ONLY
CHLAMYDIA, DNA PROBE: NEGATIVE
Neisseria Gonorrhea: NEGATIVE
Trichomonas: NEGATIVE

## 2017-08-26 LAB — RPR: RPR Ser Ql: NONREACTIVE

## 2017-08-26 LAB — HIV ANTIBODY (ROUTINE TESTING W REFLEX): HIV Screen 4th Generation wRfx: NONREACTIVE

## 2017-08-29 ENCOUNTER — Telehealth: Payer: Self-pay | Admitting: *Deleted

## 2017-08-29 LAB — URINE CYTOLOGY ANCILLARY ONLY
Bacterial vaginitis: NEGATIVE
Candida vaginitis: NEGATIVE

## 2017-08-29 NOTE — Telephone Encounter (Signed)
Patient verified DOB Patient is aware of labs being normal/negative and to follow up as planned. No further questions.

## 2017-08-29 NOTE — Telephone Encounter (Signed)
-----   Message from Angela M McClung, [place] Simmonds JerseyPA-C sent at 08/29/2017  4:41 PM EDT ----- Please call patient.  HIV, syphilis, chlamydia, gonorrhea, trichomonas, yeast and BV testing was all negative.  Follow-up as planned.  Thanks, Georgian CoAngela McClung, PA-C

## 2017-09-27 ENCOUNTER — Ambulatory Visit: Payer: Medicaid Other | Admitting: Internal Medicine

## 2017-09-29 ENCOUNTER — Ambulatory Visit: Payer: Self-pay | Attending: Internal Medicine | Admitting: Internal Medicine

## 2017-09-29 ENCOUNTER — Encounter: Payer: Self-pay | Admitting: Internal Medicine

## 2017-09-29 VITALS — BP 121/83 | HR 75 | Temp 98.6°F | Resp 16 | Wt 150.2 lb

## 2017-09-29 DIAGNOSIS — E119 Type 2 diabetes mellitus without complications: Secondary | ICD-10-CM | POA: Insufficient documentation

## 2017-09-29 DIAGNOSIS — Z79890 Hormone replacement therapy (postmenopausal): Secondary | ICD-10-CM | POA: Insufficient documentation

## 2017-09-29 DIAGNOSIS — Z794 Long term (current) use of insulin: Secondary | ICD-10-CM | POA: Insufficient documentation

## 2017-09-29 DIAGNOSIS — I10 Essential (primary) hypertension: Secondary | ICD-10-CM | POA: Insufficient documentation

## 2017-09-29 DIAGNOSIS — E1110 Type 2 diabetes mellitus with ketoacidosis without coma: Secondary | ICD-10-CM

## 2017-09-29 DIAGNOSIS — E782 Mixed hyperlipidemia: Secondary | ICD-10-CM | POA: Insufficient documentation

## 2017-09-29 DIAGNOSIS — Z23 Encounter for immunization: Secondary | ICD-10-CM | POA: Insufficient documentation

## 2017-09-29 DIAGNOSIS — IMO0001 Reserved for inherently not codable concepts without codable children: Secondary | ICD-10-CM

## 2017-09-29 DIAGNOSIS — E1165 Type 2 diabetes mellitus with hyperglycemia: Secondary | ICD-10-CM

## 2017-09-29 DIAGNOSIS — E039 Hypothyroidism, unspecified: Secondary | ICD-10-CM | POA: Insufficient documentation

## 2017-09-29 DIAGNOSIS — Z79899 Other long term (current) drug therapy: Secondary | ICD-10-CM | POA: Insufficient documentation

## 2017-09-29 LAB — POCT GLYCOSYLATED HEMOGLOBIN (HGB A1C): HBA1C, POC (CONTROLLED DIABETIC RANGE): 13 % — AB (ref 0.0–7.0)

## 2017-09-29 LAB — GLUCOSE, POCT (MANUAL RESULT ENTRY): POC Glucose: 102 mg/dl — AB (ref 70–99)

## 2017-09-29 MED ORDER — LISINOPRIL 5 MG PO TABS: 5.0000 mg | ORAL_TABLET | Freq: Every day | ORAL | 3 refills | Status: DC

## 2017-09-29 MED ORDER — INSULIN GLARGINE 100 UNIT/ML SOLOSTAR PEN: 25.0000 [IU] | PEN_INJECTOR | Freq: Every day | SUBCUTANEOUS | 6 refills | Status: AC

## 2017-09-29 MED ORDER — ATORVASTATIN CALCIUM 20 MG PO TABS: 20.0000 mg | ORAL_TABLET | Freq: Every day | ORAL | 6 refills | Status: AC

## 2017-09-29 MED ORDER — GLIPIZIDE 10 MG PO TABS: 10.0000 mg | ORAL_TABLET | Freq: Two times a day (BID) | ORAL | 6 refills | Status: AC

## 2017-09-29 MED ORDER — METFORMIN HCL ER 500 MG PO TB24: 1000.0000 mg | ORAL_TABLET | Freq: Two times a day (BID) | ORAL | 6 refills | Status: AC

## 2017-09-29 MED FILL — ATORVASTATIN 20 MG TABLET: 20 | 30 days supply | Qty: 30 | Fill #0

## 2017-09-29 MED FILL — !LANTUS SOLOSTAR 100UNITS/M: 100 | 24 days supply | Qty: 6 | Fill #0

## 2017-09-29 MED FILL — LISINOPRIL 5 MG TAB: 5 | 30 days supply | Qty: 30 | Fill #0

## 2017-09-29 MED FILL — METFORMIN HCL ER 500 MG TAB: 500 | 30 days supply | Qty: 120 | Fill #0

## 2017-09-29 MED FILL — glipiZIDE 10 MG TABS: 10 | 30 days supply | Qty: 60 | Fill #0

## 2017-09-29 NOTE — Patient Instructions (Addendum)
Stop Lisinopril/HCTZ. Change to Lisinopril 5 mg daily.   Follow a Healthy Eating Plan - You can do it! Limit sugary drinks.  Avoid sodas, sweet tea, sport or energy drinks, or fruit drinks.  Drink water, lo-fat milk, or diet drinks. Limit snack foods.   Cut back on candy, cake, cookies, chips, ice cream.  These are a special treat, only in small amounts. Eat plenty of vegetables.  Especially dark green, red, and orange vegetables. Aim for at least 3 servings a day. More is better! Include fruit in your daily diet.  Whole fruit is much healthier than fruit juice! Limit "white" bread, "white" pasta, "white" rice.   Choose "100% whole grain" products, brown or wild rice. Avoid fatty meats. Try "Meatless Monday" and choose eggs or beans one day a week.  When eating meat, choose lean meats like chicken, [place], and fish.  Grill, broil, or bake meats instead of frying, and eat poultry without the skin. Eat less salt.  Avoid frozen pizzas, frozen dinners and salty foods.  Use seasonings other than salt in cooking.  This can help blood pressure and keep you from swelling Beer, wine and liquor have calories.  If you can safely drink alcohol, limit to 1 drink per day for women, 2 drinks for men   Influenza Virus Vaccine injection (Fluarix) What is this medicine? INFLUENZA VIRUS VACCINE (in floo EN zuh VAHY ruhs vak SEEN) helps to reduce the risk of getting influenza also known as the flu. This medicine may be used for other purposes; ask your health care provider or pharmacist if you have questions. COMMON BRAND NAME(S): Fluarix, Fluzone What should I tell my health care provider before I take this medicine? They need to know if you have any of these conditions: -bleeding disorder like hemophilia -fever or infection -Guillain-Barre syndrome or other neurological problems -immune system problems -infection with the human immunodeficiency virus (HIV) or AIDS -low blood platelet counts -multiple  sclerosis -an unusual or allergic reaction to influenza virus vaccine, eggs, chicken proteins, latex, gentamicin, other medicines, foods, dyes or preservatives -pregnant or trying to get pregnant -breast-feeding How should I use this medicine? This vaccine is for injection into a muscle. It is given by a health care professional. A copy of Vaccine Information Statements will be given before each vaccination. Read this sheet carefully each time. The sheet may change frequently. Talk to your pediatrician regarding the use of this medicine in children. Special care may be needed. Overdosage: If you think you have taken too much of this medicine contact a poison control center or emergency room at once. NOTE: This medicine is only for you. Do not share this medicine with others. What if I miss a dose? This does not apply. What may interact with this medicine? -chemotherapy or radiation therapy -medicines that lower your immune system like etanercept, anakinra, infliximab, and adalimumab -medicines that treat or prevent blood clots like warfarin -phenytoin -steroid medicines like prednisone or cortisone -theophylline -vaccines This list may not describe all possible interactions. Give your health care provider a list of all the medicines, herbs, non-prescription drugs, or dietary supplements you use. Also tell them if you smoke, drink alcohol, or use illegal drugs. Some items may interact with your medicine. What should I watch for while using this medicine? Report any side effects that do not go away within 3 days to your doctor or health care professional. Call your health care provider if any unusual symptoms occur within 6 weeks of receiving  this vaccine. You may still catch the flu, but the illness is not usually as bad. You cannot get the flu from the vaccine. The vaccine will not protect against colds or other illnesses that may cause fever. The vaccine is needed every year. What side  effects may I notice from receiving this medicine? Side effects that you should report to your doctor or health care professional as soon as possible: -allergic reactions like skin rash, itching or hives, swelling of the face, lips, or tongue Side effects that usually do not require medical attention (report to your doctor or health care professional if they continue or are bothersome): -fever -headache -muscle aches and pains -pain, tenderness, redness, or swelling at site where injected -weak or tired This list may not describe all possible side effects. Call your doctor for medical advice about side effects. You may report side effects to FDA at 1-800-FDA-1088. Where should I keep my medicine? This vaccine is only given in a clinic, pharmacy, doctor's office, or other health care setting and will not be stored at home. NOTE: This sheet is a summary. It may not cover all possible information. If you have questions about this medicine, talk to your doctor, pharmacist, or health care provider.  2018 Elsevier/Gold Standard (2007-08-09 09:30:40)

## 2017-09-29 NOTE — Progress Notes (Signed)
Patient ID: Ariana Cunningham, female    DOB: 29-Mar-1970  MRN: [id number]  CC: Diabetes and Hypertension   Subjective: Ariana Cunningham is a 46 y.o. female who presents for chronic ds management.  Her concerns today include: DM, HTN, hypothyroid, HL  DM:  A1C 13 today. Checks BS QOD.  Gives range 200-300s.   Diet:  Admits that she loves juices; not eating on time due to her work schedule then over eats when she does sit down to eat. Med: out of Lantus and most of her other meds x 2 wks due to limited finances.  Out of Glipizide for same period.  Still has Metformin She is active at work.  She does housekeeping.  HTN:  Out of Lisin/HCTZ x 2 wks. denies any chest pains or shortness of breath.  No lower extremity edema.  No device to check blood pressure.  Thyroid: Reports being out of levothyroxine x2 weeks.  It was not clear to me what dose she was actually taking so on last visit with me I had them refilled only the 200 mcg dose  Patient Active Problem List   Diagnosis Date Noted  . Vitamin D deficiency 01/13/2016  . IUD contraception 05/22/2015  . DM (diabetes mellitus) type 2, uncontrolled, with ketoacidosis ([place]) 08/23/2012  . HTN (hypertension), benign 08/23/2012  . Hypothyroidism 07/06/2006  . ANEMIA, IRON DEFICIENCY NOS 07/06/2006  . Microalbuminuria 07/06/2006     Current Outpatient Medications on File Prior to Visit  Medication Sig Dispense Refill  . Blood Glucose Monitoring Suppl (TRUE METRIX METER) w/Device KIT 1 each by Does not apply route as needed. 1 kit 0  . glucose blood (TRUE METRIX BLOOD GLUCOSE TEST) test strip 1 each by Other route 3 (three) times daily. 100 each 11  . Insulin Pen Needle (ULTICARE MICRO PEN NEEDLES) 32G X 4 MM MISC 1 applicator by Does not apply route at bedtime. 100 each 2  . Lancets Misc. (ACCU-CHEK MULTICLIX LANCET DEV) KIT 1 kit by Does not apply route 4 (four) times daily -  before meals and at bedtime. 1 each 12  . levothyroxine  (SYNTHROID, LEVOTHROID) 100 MCG tablet Take 1 tablet (100 mcg total) by mouth daily. Take with her 200 MCG tablet daily 90 tablet 3  . levothyroxine (SYNTHROID, LEVOTHROID) 200 MCG tablet Take 1 tablet (200 mcg total) by mouth daily. 90 tablet 3  . TRUEPLUS LANCETS 28G MISC 1 each by Does not apply route 3 (three) times daily. 100 each 11  . Vitamin D, Ergocalciferol, (DRISDOL) 50000 units CAPS capsule Take 1 capsule (50,000 Units total) by mouth every 7 (seven) days. 12 capsule 0   No current facility-administered medications on file prior to visit.     No Known Allergies  Social History   Socioeconomic History  . Marital status: Single    Spouse name: Not on file  . Number of children: Not on file  . Years of education: Not on file  . Highest education level: Not on file  Occupational History  . Not on file  Social Needs  . Financial resource strain: Not on file  . Food insecurity:    Worry: Not on file    Inability: Not on file  . Transportation needs:    Medical: Not on file    Non-medical: Not on file  Tobacco Use  . Smoking status: Never Smoker  . Smokeless tobacco: Never Used  Substance and Sexual Activity  . Alcohol use: No  .  Drug use: No  . Sexual activity: Yes    Partners: Male    Birth control/protection: IUD  Lifestyle  . Physical activity:    Days per week: Not on file    Minutes per session: Not on file  . Stress: Not on file  Relationships  . Social connections:    Talks on phone: Not on file    Gets together: Not on file    Attends religious service: Not on file    Active member of club or organization: Not on file    Attends meetings of clubs or organizations: Not on file    Relationship status: Not on file  . Intimate partner violence:    Fear of current or ex partner: Not on file    Emotionally abused: Not on file    Physically abused: Not on file    Forced sexual activity: Not on file  Other Topics Concern  . Not on file  Social History  Narrative  . Not on file    No family history on file.  Past Surgical History:  Procedure Laterality Date  . CESAREAN SECTION      ROS: Review of Systems Negative except as above. PHYSICAL EXAM: BP 121/83   Pulse 75   Temp 98.6 F (37 C) (Oral)   Resp 16   Wt 150 lb 3.2 oz (68.1 kg)   SpO2 99%   BMI 25.78 kg/m   Repeat BP 132/70  Physical Exam  General appearance - alert, well appearing, and in no distress Mental status - normal mood, behavior, speech, dress, motor activity, and thought processes Neck - supple, no significant adenopathy Chest - clear to auscultation, no wheezes, rales or rhonchi, symmetric air entry Heart - normal rate, regular rhythm, normal S1, S2, no murmurs, rubs, clicks or gallops Extremities - peripheral pulses normal, no pedal edema, no clubbing or cyanosis   Results for orders placed or performed in visit on 09/29/17  POCT glucose (manual entry)  Result Value Ref Range   POC Glucose 102 (A) 70 - 99 mg/dl  POCT glycosylated hemoglobin (Hb A1C)  Result Value Ref Range   Hemoglobin A1C     HbA1c POC (<> result, manual entry)     HbA1c, POC (prediabetic range)     HbA1c, POC (controlled diabetic range) 13.0 (A) 0.0 - 7.0 %    ASSESSMENT AND PLAN: 1. Diabetes mellitus type 2, uncontrolled, without complications (HCC) Poor compliance partially due to financial difficulties.  Advised her to apply for the blue card at the pharmacy which should help.  Refill Lantus, glipizide and metformin.  Dietary counseling given.  Advised to stop consumption of all sugary drinks - POCT glucose (manual entry) - POCT glycosylated hemoglobin (Hb A1C) - Microalbumin / creatinine urine ratio - metFORMIN (GLUCOPHAGE XR) 500 MG 24 hr tablet; Take 2 tablets (1,000 mg total) by mouth 2 (two) times daily with a meal.  Dispense: 120 tablet; Refill: 6 - Insulin Glargine (LANTUS SOLOSTAR) 100 UNIT/ML Solostar Pen; Inject 25 Units into the skin daily at 10 pm.  Dispense:  30 mL; Refill: 6 - glipiZIDE (GLUCOTROL) 10 MG tablet; Take 1 tablet (10 mg total) by mouth 2 (two) times daily before a meal.  Dispense: 60 tablet; Refill: 6  2. Essential hypertension Blood pressure looks good.  Instead of putting her back on lisinopril/HCTZ, I will use just a low dose of lisinopril. - lisinopril (PRINIVIL,ZESTRIL) 5 MG tablet; Take 1 tablet (5 mg total) by mouth daily.  Dispense: 90 tablet; Refill: 3  3. Mixed hyperlipidemia - Lipid panel - atorvastatin (LIPITOR) 20 MG tablet; Take 1 tablet (20 mg total) by mouth daily.  Dispense: 30 tablet; Refill: 6  4. Need for immunization against influenza - Flu Vaccine QUAD 36+ mos IM  Patient was given the opportunity to ask questions.  Patient verbalized understanding of the plan and was able to repeat key elements of the plan.   Orders Placed This Encounter  Procedures  . Flu Vaccine QUAD 36+ mos IM  . Microalbumin / creatinine urine ratio  . Lipid panel  . POCT glucose (manual entry)  . POCT glycosylated hemoglobin (Hb A1C)     Requested Prescriptions   Signed Prescriptions Disp Refills  . lisinopril (PRINIVIL,ZESTRIL) 5 MG tablet 90 tablet 3    Sig: Take 1 tablet (5 mg total) by mouth daily.  . metFORMIN (GLUCOPHAGE XR) 500 MG 24 hr tablet 120 tablet 6    Sig: Take 2 tablets (1,000 mg total) by mouth 2 (two) times daily with a meal.  . atorvastatin (LIPITOR) 20 MG tablet 30 tablet 6    Sig: Take 1 tablet (20 mg total) by mouth daily.  . Insulin Glargine (LANTUS SOLOSTAR) 100 UNIT/ML Solostar Pen 30 mL 6    Sig: Inject 25 Units into the skin daily at 10 pm.  . glipiZIDE (GLUCOTROL) 10 MG tablet 60 tablet 6    Sig: Take 1 tablet (10 mg total) by mouth 2 (two) times daily before a meal.    Return in about 2 months (around 11/29/2017).  Karle Plumber, MD, FACP

## 2017-09-30 LAB — MICROALBUMIN / CREATININE URINE RATIO
CREATININE, UR: 91.6 mg/dL
MICROALB/CREAT RATIO: 122.2 mg/g{creat} — AB (ref 0.0–30.0)
MICROALBUM., U, RANDOM: 111.9 ug/mL

## 2017-09-30 LAB — LIPID PANEL
Chol/HDL Ratio: 3.2 ratio (ref 0.0–4.4)
Cholesterol, Total: 241 mg/dL — ABNORMAL HIGH (ref 100–199)
HDL: 75 mg/dL (ref >39)
LDL Calculated: 123 mg/dL — ABNORMAL HIGH (ref 0–99)
Triglycerides: 214 mg/dL — ABNORMAL HIGH (ref 0–149)
VLDL Cholesterol Cal: 43 mg/dL — ABNORMAL HIGH (ref 5–40)

## 2017-10-05 ENCOUNTER — Telehealth: Payer: Self-pay

## 2017-10-05 NOTE — Telephone Encounter (Signed)
Contacted pt to go over lab results pt didn't answer lvm asking pt to give me a call at her earliest convenience  If pt calls back please give results: LDL cholesterol is 123 with goal being less than 70. Take the Atorvastatin as prescribed to help lower cholesterol and risk for heart attack/stroke. Has protein in the urine which is an early indication of DM affecting the kidneys. Take the Lisinopril as prescribed to help protect the kidneys.

## 2017-10-06 ENCOUNTER — Telehealth: Payer: Self-pay | Admitting: Internal Medicine

## 2017-10-06 NOTE — Telephone Encounter (Signed)
Patient called, verified DOB, gave lab results, patient had no questions or concerns.

## 2017-11-03 MED FILL — ?LEVOTHYROXINE 100 MCG TAB: 100 | 30 days supply | Qty: 30 | Fill #1

## 2017-11-03 MED FILL — LEVOTHYROXINE 200 MCG TAB: 200 | 30 days supply | Qty: 30 | Fill #1

## 2017-11-29 ENCOUNTER — Ambulatory Visit: Payer: Medicaid Other | Admitting: Internal Medicine

## 2017-12-20 ENCOUNTER — Ambulatory Visit: Payer: Self-pay | Attending: Internal Medicine | Admitting: Internal Medicine

## 2017-12-20 ENCOUNTER — Encounter: Payer: Self-pay | Admitting: Internal Medicine

## 2017-12-20 VITALS — BP 156/81 | HR 77 | Temp 98.1°F | Resp 16 | Wt 153.4 lb

## 2017-12-20 DIAGNOSIS — Z975 Presence of (intrauterine) contraceptive device: Secondary | ICD-10-CM | POA: Insufficient documentation

## 2017-12-20 DIAGNOSIS — E039 Hypothyroidism, unspecified: Secondary | ICD-10-CM | POA: Insufficient documentation

## 2017-12-20 DIAGNOSIS — E119 Type 2 diabetes mellitus without complications: Secondary | ICD-10-CM | POA: Insufficient documentation

## 2017-12-20 DIAGNOSIS — I10 Essential (primary) hypertension: Secondary | ICD-10-CM | POA: Insufficient documentation

## 2017-12-20 DIAGNOSIS — Z79899 Other long term (current) drug therapy: Secondary | ICD-10-CM | POA: Insufficient documentation

## 2017-12-20 DIAGNOSIS — E559 Vitamin D deficiency, unspecified: Secondary | ICD-10-CM | POA: Insufficient documentation

## 2017-12-20 DIAGNOSIS — Z79890 Hormone replacement therapy (postmenopausal): Secondary | ICD-10-CM | POA: Insufficient documentation

## 2017-12-20 DIAGNOSIS — E1165 Type 2 diabetes mellitus with hyperglycemia: Secondary | ICD-10-CM

## 2017-12-20 DIAGNOSIS — R809 Proteinuria, unspecified: Secondary | ICD-10-CM

## 2017-12-20 DIAGNOSIS — Z794 Long term (current) use of insulin: Secondary | ICD-10-CM | POA: Insufficient documentation

## 2017-12-20 LAB — GLUCOSE, POCT (MANUAL RESULT ENTRY): POC Glucose: 240 mg/dl — AB (ref 70–99)

## 2017-12-20 MED ORDER — GLUCOSE BLOOD VI STRP: 1.0000 | ORAL_STRIP | Freq: Three times a day (TID) | 11 refills | Status: AC

## 2017-12-20 MED ORDER — LISINOPRIL 10 MG PO TABS: 10.0000 mg | ORAL_TABLET | Freq: Every day | ORAL | 6 refills | Status: AC

## 2017-12-20 MED FILL — LEVOTHYROXINE 200 MCG TAB: 200 | 30 days supply | Qty: 30 | Fill #2

## 2017-12-20 MED FILL — LISINOPRIL 10 MG TABS: 10 | 30 days supply | Qty: 30 | Fill #0

## 2017-12-20 MED FILL — !LANTUS SOLOSTAR 100UNITS/M: 100 | 24 days supply | Qty: 6 | Fill #1

## 2017-12-20 MED FILL — TRUE METRIX TEST STRIP: 30 days supply | Qty: 100 | Fill #0

## 2017-12-20 MED FILL — glipiZIDE 10 MG TABS: 10 | 30 days supply | Qty: 60 | Fill #1

## 2017-12-20 MED FILL — ATORVASTATIN 20 MG TABLET: 20 | 30 days supply | Qty: 30 | Fill #1

## 2017-12-20 MED FILL — ?LEVOTHYROXINE 100 MCG TAB: 100 | 30 days supply | Qty: 30 | Fill #2

## 2017-12-20 NOTE — Progress Notes (Signed)
Patient ID: AVERYANNA Cunningham, female    DOB: 01/28/1970  MRN: [id number]  CC: Diabetes   Subjective: Ariana Cunningham is a 47 y.o. female who presents for chronic disease management. Her concerns today include:  DM, HTN, hypothyroid, HL  DM: checking BS 1-3 x day up until 2 wks ago when she ran out of stripes. Reports morning BS in the 90s.  Rest of the day numbers in the 160s.  Reports compliance with Lantus and Metformin and Glipizide BID Eating habits: "I try to eat small portions but I'm very hungry."  She is eating less of rice and potatoes, she has cut back on drinking juices and trying to drink more water. "I'm pretty activity.  I work 2 jobs that require a lot of running back and forth." She had protein in the urine that was checked 09/2017. She denies any numbness or tingling in the extremities. She denies any blurred vision.  She is overdue for an eye exam.  She does not have insurance.  HTN:  Reports compliance with Lisinopril and salt restriction.  Denies any chest pains, shortness of breath, lower extremity edema, headaches or dizziness. No device to check BP  Thyroid:  Reportedly taking 300 mcg total of Levothyroxine daily.  Denies any palpitations.  She has not had any major weight changes.  Patient Active Problem List   Diagnosis Date Noted  . Vitamin D deficiency 01/13/2016  . IUD contraception 05/22/2015  . DM (diabetes mellitus) type 2, uncontrolled, with ketoacidosis ([place]) 08/23/2012  . HTN (hypertension), benign 08/23/2012  . Hypothyroidism 07/06/2006  . ANEMIA, IRON DEFICIENCY NOS 07/06/2006  . Microalbuminuria 07/06/2006     Current Outpatient Medications on File Prior to Visit  Medication Sig Dispense Refill  . atorvastatin (LIPITOR) 20 MG tablet Take 1 tablet (20 mg total) by mouth daily. 30 tablet 6  . Blood Glucose Monitoring Suppl (TRUE METRIX METER) w/Device KIT 1 each by Does not apply route as needed. 1 kit 0  . glipiZIDE (GLUCOTROL) 10 MG tablet  Take 1 tablet (10 mg total) by mouth 2 (two) times daily before a meal. 60 tablet 6  . Insulin Glargine (LANTUS SOLOSTAR) 100 UNIT/ML Solostar Pen Inject 25 Units into the skin daily at 10 pm. 30 mL 6  . Insulin Pen Needle (ULTICARE MICRO PEN NEEDLES) 32G X 4 MM MISC 1 applicator by Does not apply route at bedtime. 100 each 2  . Lancets Misc. (ACCU-CHEK MULTICLIX LANCET DEV) KIT 1 kit by Does not apply route 4 (four) times daily -  before meals and at bedtime. 1 each 12  . levothyroxine (SYNTHROID, LEVOTHROID) 100 MCG tablet Take 1 tablet (100 mcg total) by mouth daily. Take with her 200 MCG tablet daily 90 tablet 3  . levothyroxine (SYNTHROID, LEVOTHROID) 200 MCG tablet Take 1 tablet (200 mcg total) by mouth daily. 90 tablet 3  . metFORMIN (GLUCOPHAGE XR) 500 MG 24 hr tablet Take 2 tablets (1,000 mg total) by mouth 2 (two) times daily with a meal. 120 tablet 6  . TRUEPLUS LANCETS 28G MISC 1 each by Does not apply route 3 (three) times daily. 100 each 11  . Vitamin D, Ergocalciferol, (DRISDOL) 50000 units CAPS capsule Take 1 capsule (50,000 Units total) by mouth every 7 (seven) days. 12 capsule 0   No current facility-administered medications on file prior to visit.     No Known Allergies  Social History   Socioeconomic History  . Marital status: Single  Spouse name: Not on file  . Number of children: Not on file  . Years of education: Not on file  . Highest education level: Not on file  Occupational History  . Not on file  Social Needs  . Financial resource strain: Not on file  . Food insecurity:    Worry: Not on file    Inability: Not on file  . Transportation needs:    Medical: Not on file    Non-medical: Not on file  Tobacco Use  . Smoking status: Never Smoker  . Smokeless tobacco: Never Used  Substance and Sexual Activity  . Alcohol use: No  . Drug use: No  . Sexual activity: Yes    Partners: Male    Birth control/protection: IUD  Lifestyle  . Physical activity:     Days per week: Not on file    Minutes per session: Not on file  . Stress: Not on file  Relationships  . Social connections:    Talks on phone: Not on file    Gets together: Not on file    Attends religious service: Not on file    Active member of club or organization: Not on file    Attends meetings of clubs or organizations: Not on file    Relationship status: Not on file  . Intimate partner violence:    Fear of current or ex partner: Not on file    Emotionally abused: Not on file    Physically abused: Not on file    Forced sexual activity: Not on file  Other Topics Concern  . Not on file  Social History Narrative  . Not on file    No family history on file.  Past Surgical History:  Procedure Laterality Date  . CESAREAN SECTION      ROS: Review of Systems Negative except as above. PHYSICAL EXAM: BP (!) 156/81   Pulse 77   Temp 98.1 F (36.7 C) (Oral)   Resp 16   Wt 153 lb 6.4 oz (69.6 kg)   SpO2 100%   BMI 26.33 kg/m   Wt Readings from Last 3 Encounters:  12/20/17 153 lb 6.4 oz (69.6 kg)  09/29/17 150 lb 3.2 oz (68.1 kg)  08/25/17 152 lb (68.9 kg)    Physical Exam  General appearance - alert, well appearing, and in no distress Mental status - normal mood, behavior, speech, dress, motor activity, and thought processes Eyes - pupils equal and reactive, extraocular eye movements intact Mouth - mucous membranes moist, pharynx normal without lesions Neck - supple, no significant adenopathy Chest - clear to auscultation, no wheezes, rales or rhonchi, symmetric air entry Heart - normal rate, regular rhythm, normal S1, S2, no murmurs, rubs, clicks or gallops Extremities -mild soft tissue edema of the right foot compared to left. Diabetic Foot Exam - Simple   Simple Foot Form Visual Inspection See comments:  Yes Sensation Testing Intact to touch and monofilament testing bilaterally:  Yes Pulse Check Posterior Tibialis and Dorsalis pulse intact bilaterally:   Yes Comments Mild soft tissue edema of RT foot compared to LT.      Results for orders placed or performed in visit on 12/20/17  POCT glucose (manual entry)  Result Value Ref Range   POC Glucose 240 (A) 70 - 99 mg/dl   Lab Results  Component Value Date   CHOL 241 (H) 09/29/2017   HDL 75 09/29/2017   LDLCALC 123 (H) 09/29/2017   TRIG 214 (H) 09/29/2017   CHOLHDL  3.2 09/29/2017     Chemistry      Component Value Date/Time   NA 134 03/16/2017 1057   K 4.1 03/16/2017 1057   CL 98 03/16/2017 1057   CO2 22 03/16/2017 1057   BUN 13 03/16/2017 1057   CREATININE 1.06 (H) 03/16/2017 1057   CREATININE 1.07 12/02/2015 1011      Component Value Date/Time   CALCIUM 9.7 03/16/2017 1057   ALKPHOS 79 03/16/2017 1057   AST 14 03/16/2017 1057   ALT 20 03/16/2017 1057   BILITOT 0.2 03/16/2017 1057     Urine microalbumin 122  ASSESSMENT AND PLAN: 1. Controlled type 2 diabetes mellitus without complication, with long-term current use of insulin ([facility]CC) RF test stripes. Continue to monitor BS.  Went over goal for BS before and 2 hrs after meals I spent some time in dietary counseling and printed material given Continue Lantus, metformin and glipizide. Bring in blood sugar readings on next visit Advised patient to have an eye exam at least once a year.  I have given her names of places that are reasonably priced for routine eye exam. - POCT glucose (manual entry) - glucose blood (TRUE METRIX BLOOD GLUCOSE TEST) test strip; 1 each by Other route 3 (three) times daily.  Dispense: 100 each; Refill: 11 - Comprehensive metabolic panel  2. Essential hypertension Not at goal.  Increase lisinopril to 10 mg daily. - lisinopril (PRINIVIL,ZESTRIL) 10 MG tablet; Take 1 tablet (10 mg total) by mouth daily.  Dispense: 30 tablet; Refill: 6  3. Hypothyroidism, unspecified type Check TSH today.  She will continue levothyroxine 300 mcg daily. - TSH     Patient was given the opportunity to ask  questions.  Patient verbalized understanding of the plan and was able to repeat key elements of the plan.   Orders Placed This Encounter  Procedures  . TSH  . Comprehensive metabolic panel  . POCT glucose (manual entry)     Requested Prescriptions   Signed Prescriptions Disp Refills  . glucose blood (TRUE METRIX BLOOD GLUCOSE TEST) test strip 100 each 11    Sig: 1 each by Other route 3 (three) times daily.  Marland Kitchen lisinopril (PRINIVIL,ZESTRIL) 10 MG tablet 30 tablet 6    Sig: Take 1 tablet (10 mg total) by mouth daily.    Return in about 3 months (around 03/22/2018).  Karle Plumber, MD, FACP

## 2017-12-20 NOTE — Patient Instructions (Signed)
Diabetes Mellitus and Nutrition When you have diabetes (diabetes mellitus), it is very important to have healthy eating habits because your blood sugar (glucose) levels are greatly affected by what you eat and drink. Eating healthy foods in the appropriate amounts, at about the same times every day, can help you:  Control your blood glucose.  Lower your risk of heart disease.  Improve your blood pressure.  Reach or maintain a healthy weight.  Every person with diabetes is different, and each person has different needs for a meal plan. Your health care provider may recommend that you work with a diet and nutrition specialist (dietitian) to make a meal plan that is best for you. Your meal plan may vary depending on factors such as:  The calories you need.  The medicines you take.  Your weight.  Your blood glucose, blood pressure, and cholesterol levels.  Your activity level.  Other health conditions you have, such as heart or kidney disease.  How do carbohydrates affect me? Carbohydrates affect your blood glucose level more than any other type of food. Eating carbohydrates naturally increases the amount of glucose in your blood. Carbohydrate counting is a method for keeping track of how many carbohydrates you eat. Counting carbohydrates is important to keep your blood glucose at a healthy level, especially if you use insulin or take certain oral diabetes medicines. It is important to know how many carbohydrates you can safely have in each meal. This is different for every person. Your dietitian can help you calculate how many carbohydrates you should have at each meal and for snack. Foods that contain carbohydrates include:  Bread, cereal, rice, pasta, and crackers.  Potatoes and corn.  Peas, beans, and lentils.  Milk and yogurt.  Fruit and juice.  Desserts, such as cakes, cookies, ice cream, and candy.  How does alcohol affect me? Alcohol can cause a sudden decrease in blood  glucose (hypoglycemia), especially if you use insulin or take certain oral diabetes medicines. Hypoglycemia can be a life-threatening condition. Symptoms of hypoglycemia (sleepiness, dizziness, and confusion) are similar to symptoms of having too much alcohol. If your health care provider says that alcohol is safe for you, follow these guidelines:  Limit alcohol intake to no more than 1 drink per day for nonpregnant women and 2 drinks per day for men. One drink equals 12 oz of beer, 5 oz of wine, or 1 oz of hard liquor.  Do not drink on an empty stomach.  Keep yourself hydrated with water, diet soda, or unsweetened iced tea.  Keep in mind that regular soda, juice, and other mixers may contain a lot of sugar and must be counted as carbohydrates.  What are tips for following this plan? Reading food labels  Start by checking the serving size on the label. The amount of calories, carbohydrates, fats, and other nutrients listed on the label are based on one serving of the food. Many foods contain more than one serving per package.  Check the total grams (g) of carbohydrates in one serving. You can calculate the number of servings of carbohydrates in one serving by dividing the total carbohydrates by 15. For example, if a food has 30 g of total carbohydrates, it would be equal to 2 servings of carbohydrates.  Check the number of grams (g) of saturated and trans fats in one serving. Choose foods that have low or no amount of these fats.  Check the number of milligrams (mg) of sodium in one serving. Most people   should limit total sodium intake to less than 2,300 mg per day.  Always check the nutrition information of foods labeled as "low-fat" or "nonfat". These foods may be higher in added sugar or refined carbohydrates and should be avoided.  Talk to your dietitian to identify your daily goals for nutrients listed on the label. Shopping  Avoid buying canned, premade, or processed foods. These  foods tend to be high in fat, sodium, and added sugar.  Shop around the outside edge of the grocery store. This includes fresh fruits and vegetables, bulk grains, fresh meats, and fresh dairy. Cooking  Use low-heat cooking methods, such as baking, instead of high-heat cooking methods like deep frying.  Cook using healthy oils, such as olive, canola, or sunflower oil.  Avoid cooking with butter, cream, or high-fat meats. Meal planning  Eat meals and snacks regularly, preferably at the same times every day. Avoid going long periods of time without eating.  Eat foods high in fiber, such as fresh fruits, vegetables, beans, and whole grains. Talk to your dietitian about how many servings of carbohydrates you can eat at each meal.  Eat 4-6 ounces of lean protein each day, such as lean meat, chicken, fish, eggs, or tofu. 1 ounce is equal to 1 ounce of meat, chicken, or fish, 1 egg, or 1/4 cup of tofu.  Eat some foods each day that contain healthy fats, such as avocado, nuts, seeds, and fish. Lifestyle   Check your blood glucose regularly.  Exercise at least 30 minutes 5 or more days each week, or as told by your health care provider.  Take medicines as told by your health care provider.  Do not use any products that contain nicotine or tobacco, such as cigarettes and e-cigarettes. If you need help quitting, ask your health care provider.  Work with a counselor or diabetes educator to identify strategies to manage stress and any emotional and social challenges. What are some questions to ask my health care provider?  Do I need to meet with a diabetes educator?  Do I need to meet with a dietitian?  What number can I call if I have questions?  When are the best times to check my blood glucose? Where to find more information:  American Diabetes Association: diabetes.org/food-and-fitness/food  Academy of Nutrition and Dietetics:  www.eatright.org/resources/health/diseases-and-conditions/diabetes  National Institute of Diabetes and Digestive and Kidney Diseases (NIH): www.niddk.nih.gov/health-information/diabetes/overview/diet-eating-physical-activity Summary  A healthy meal plan will help you control your blood glucose and maintain a healthy lifestyle.  Working with a diet and nutrition specialist (dietitian) can help you make a meal plan that is best for you.  Keep in mind that carbohydrates and alcohol have immediate effects on your blood glucose levels. It is important to count carbohydrates and to use alcohol carefully. This information is not intended to replace advice given to you by your health care provider. Make sure you discuss any questions you have with your health care provider. Document Released: 10/08/2004 Document Revised: 02/16/2016 Document Reviewed: 02/16/2016 Elsevier Interactive Patient Education  2018 [employer]  

## 2017-12-21 ENCOUNTER — Telehealth: Payer: Self-pay

## 2017-12-21 ENCOUNTER — Other Ambulatory Visit: Payer: Self-pay | Admitting: Internal Medicine

## 2017-12-21 DIAGNOSIS — E039 Hypothyroidism, unspecified: Secondary | ICD-10-CM

## 2017-12-21 LAB — COMPREHENSIVE METABOLIC PANEL
ALK PHOS: 86 IU/L (ref 39–117)
ALT: 57 IU/L — ABNORMAL HIGH (ref 0–32)
AST: 41 IU/L — AB (ref 0–40)
Albumin/Globulin Ratio: 1.6 (ref 1.2–2.2)
Albumin: 4.4 g/dL (ref 3.5–5.5)
BILIRUBIN TOTAL: 0.2 mg/dL (ref 0.0–1.2)
BUN/Creatinine Ratio: 20 (ref 9–23)
BUN: 19 mg/dL (ref 6–24)
CHLORIDE: 100 mmol/L (ref 96–106)
CO2: 20 mmol/L (ref 20–29)
Calcium: 9.7 mg/dL (ref 8.7–10.2)
Creatinine, Ser: 0.94 mg/dL (ref 0.57–1.00)
GFR calc Af Amer: 85 mL/min/{1.73_m2} (ref >59)
GFR calc non Af Amer: 74 mL/min/{1.73_m2} (ref >59)
GLOBULIN, TOTAL: 2.8 g/dL (ref 1.5–4.5)
GLUCOSE: 217 mg/dL — AB (ref 65–99)
Potassium: 4.5 mmol/L (ref 3.5–5.2)
Sodium: 135 mmol/L (ref 134–144)
Total Protein: 7.2 g/dL (ref 6.0–8.5)

## 2017-12-21 LAB — TSH: TSH: 0.174 u[IU]/mL — ABNORMAL LOW (ref 0.450–4.500)

## 2017-12-21 MED ORDER — LEVOTHYROXINE SODIUM 100 MCG PO TABS: 50.0000 ug | ORAL_TABLET | Freq: Every day | ORAL | 3 refills | Status: DC

## 2017-12-21 MED FILL — LEVOTHYROXINE 100 MCG TAB: 100 | 30 days supply | Qty: 15 | Fill #0

## 2017-12-21 NOTE — Telephone Encounter (Signed)
Contacted pt to go over lab results pt is aware and doesn't have any questions or concerns 

## 2018-02-24 MED FILL — LISINOPRIL 10 MG TABS: 10 | 30 days supply | Qty: 30 | Fill #1

## 2018-02-24 MED FILL — ATORVASTATIN 20 MG TABLET: 20 | 30 days supply | Qty: 30 | Fill #2

## 2018-02-24 MED FILL — glipiZIDE 10 MG TABS: 10 | 30 days supply | Qty: 60 | Fill #2

## 2018-02-24 MED FILL — LEVOTHYROXINE 200 MCG TAB: 200 | 30 days supply | Qty: 30 | Fill #3

## 2018-02-24 MED FILL — ?LEVOTHYROXINE 100 MCG TAB: 100 | 30 days supply | Qty: 30 | Fill #3

## 2018-02-27 MED FILL — LANTUS SOLOSTAR 100 UNITS/M: 100 | 24 days supply | Qty: 6 | Fill #2

## 2018-02-27 MED FILL — !LANTUS SOLOSTAR 100UNITS/M: 100 | 24 days supply | Qty: 6 | Fill #2

## 2018-03-23 ENCOUNTER — Ambulatory Visit: Payer: Self-pay | Attending: Internal Medicine | Admitting: Internal Medicine

## 2018-03-23 ENCOUNTER — Encounter: Payer: Self-pay | Admitting: Internal Medicine

## 2018-03-23 VITALS — BP 145/76 | HR 87 | Temp 98.3°F | Resp 16 | Wt 155.8 lb

## 2018-03-23 DIAGNOSIS — Z794 Long term (current) use of insulin: Secondary | ICD-10-CM

## 2018-03-23 DIAGNOSIS — Z1239 Encounter for other screening for malignant neoplasm of breast: Secondary | ICD-10-CM

## 2018-03-23 DIAGNOSIS — E782 Mixed hyperlipidemia: Secondary | ICD-10-CM | POA: Insufficient documentation

## 2018-03-23 DIAGNOSIS — E1129 Type 2 diabetes mellitus with other diabetic kidney complication: Secondary | ICD-10-CM

## 2018-03-23 DIAGNOSIS — E890 Postprocedural hypothyroidism: Secondary | ICD-10-CM

## 2018-03-23 DIAGNOSIS — E1165 Type 2 diabetes mellitus with hyperglycemia: Secondary | ICD-10-CM

## 2018-03-23 DIAGNOSIS — R809 Proteinuria, unspecified: Secondary | ICD-10-CM

## 2018-03-23 DIAGNOSIS — IMO0002 Reserved for concepts with insufficient information to code with codable children: Secondary | ICD-10-CM

## 2018-03-23 DIAGNOSIS — I10 Essential (primary) hypertension: Secondary | ICD-10-CM | POA: Insufficient documentation

## 2018-03-23 LAB — POCT GLYCOSYLATED HEMOGLOBIN (HGB A1C): HBA1C, POC (CONTROLLED DIABETIC RANGE): 9.9 % — AB (ref 0.0–7.0)

## 2018-03-23 LAB — GLUCOSE, POCT (MANUAL RESULT ENTRY): POC GLUCOSE: 132 mg/dL — AB (ref 70–99)

## 2018-03-23 MED ORDER — FREESTYLE LIBRE SENSOR SYSTEM MISC: 12 refills | Status: AC

## 2018-03-23 MED ORDER — HYDROCHLOROTHIAZIDE 12.5 MG PO TABS: 12.5000 mg | ORAL_TABLET | Freq: Every day | ORAL | 3 refills | Status: AC

## 2018-03-23 MED ORDER — DAPAGLIFLOZIN PROPANEDIOL 5 MG PO TABS: 5.0000 mg | ORAL_TABLET | Freq: Every day | ORAL | 4 refills | Status: AC

## 2018-03-23 MED ORDER — FREESTYLE LIBRE READER DEVI: 1.0000 | Freq: Once | 0 refills | Status: AC

## 2018-03-23 NOTE — Progress Notes (Signed)
Patient ID: Ariana Cunningham, female    DOB: 1970-09-01  MRN: [id number]  CC: Diabetes   Subjective: Ariana Cunningham is a 48 y.o. female who presents for chronic disease management. DM, HTN, hypothyroid, HL  Pt states she just she just got [employer] and network will be [facility] so she may not be able to return to see me again.  DIABETES TYPE 2 Last A1C:   Results for orders placed or performed in visit on 03/23/18  POCT glucose (manual entry)  Result Value Ref Range   POC Glucose 132 (A) 70 - 99 mg/dl  POCT glycosylated hemoglobin (Hb A1C)  Result Value Ref Range   Hemoglobin A1C     HbA1c POC (<> result, manual entry)     HbA1c, POC (prediabetic range)     HbA1c, POC (controlled diabetic range) 9.9 (A) 0.0 - 7.0 %    Med Adherence:  [x]  Yes    []  No Medication side effects:  []  Yes    [x]  No Home Monitoring?  [x]  Yes but forgot to bring log with her.  Check once a day.  "I have a fear of checking."  Request   Libre or continuous monitoring system Home glucose results range: Gives morning range in 120s in a.m and 156-low 200 in the afternoons Diet Adherence: [x]  Yes - drinks mainly water and a drink called Body Armour that contains 26 gram of sugar.  Eats a chocolate bar once Q 2 wks.  Has cut back on white carbs      Exercise: [x]  Yes - does a lot of walking up and down stairs at work     Hypoglycemic episodes?: [x]  Yes, once since last visit.  Came up easily    []  No Numbness of the feet? []  Yes    [x]  No Retinopathy hx? []  Yes    [x]  No Last eye exam: over due Comments:  Has microalbuminuria  HYPERTENSION Currently taking: see medication list Med Adherence: [x]  Yes    []  No Medication side effects: []  Yes    [x]  No Adherence with salt restriction: [x]  Yes    []  No Home Monitoring?: []  Yes    [x]  No, but has device Monitoring Frequency: []  Yes    []  No Home BP results range: []  Yes    []  No SOB? []  Yes    [x]  No Chest Pain?: []  Yes    [x]  No Leg  swelling?: []  Yes    [x]  No Headaches?: []  Yes    [x]  No Dizziness? []  Yes    [x]  No Comments:   HL:  Taking Lipitor.  Mild elevation of AST/ALT on last test  Thyroid:  Reports having ablation for Graves yrs ago.  TSH level suppressed on last blood draw.  I recommended.  She was on 200+100 mcg of levothyroxine at the time.  I recommended continuing the 200 mcg and taking a half of the 100 mcg to make 250 MCG a day.  However she has been taking 1/2 200 mcg with a 100 mcg tab to equal to 100 MCG's a day.  She denies any palpitations or heat or cold intolerance.  She has not had any major weight changes  Patient Active Problem List   Diagnosis Date Noted  . Vitamin D deficiency 01/13/2016  . IUD contraception 05/22/2015  . DM (diabetes mellitus) type 2, uncontrolled, with ketoacidosis ([place]) 08/23/2012  . HTN (hypertension), benign 08/23/2012  . Hypothyroidism 07/06/2006  . ANEMIA,  IRON DEFICIENCY NOS 07/06/2006  . Microalbuminuria 07/06/2006     Current Outpatient Medications on File Prior to Visit  Medication Sig Dispense Refill  . atorvastatin (LIPITOR) 20 MG tablet Take 1 tablet (20 mg total) by mouth daily. 30 tablet 6  . Blood Glucose Monitoring Suppl (TRUE METRIX METER) w/Device KIT 1 each by Does not apply route as needed. 1 kit 0  . glipiZIDE (GLUCOTROL) 10 MG tablet Take 1 tablet (10 mg total) by mouth 2 (two) times daily before a meal. 60 tablet 6  . glucose blood (TRUE METRIX BLOOD GLUCOSE TEST) test strip 1 each by Other route 3 (three) times daily. 100 each 11  . Insulin Glargine (LANTUS SOLOSTAR) 100 UNIT/ML Solostar Pen Inject 25 Units into the skin daily at 10 pm. 30 mL 6  . Insulin Pen Needle (ULTICARE MICRO PEN NEEDLES) 32G X 4 MM MISC 1 applicator by Does not apply route at bedtime. 100 each 2  . Lancets Misc. (ACCU-CHEK MULTICLIX LANCET DEV) KIT 1 kit by Does not apply route 4 (four) times daily -  before meals and at bedtime. 1 each 12  . levothyroxine (SYNTHROID,  LEVOTHROID) 100 MCG tablet Take 0.5 tablets (50 mcg total) by mouth daily. Take with her 200 MCG tablet daily 90 tablet 3  . levothyroxine (SYNTHROID, LEVOTHROID) 200 MCG tablet Take 1 tablet (200 mcg total) by mouth daily. 90 tablet 3  . lisinopril (PRINIVIL,ZESTRIL) 10 MG tablet Take 1 tablet (10 mg total) by mouth daily. 30 tablet 6  . metFORMIN (GLUCOPHAGE XR) 500 MG 24 hr tablet Take 2 tablets (1,000 mg total) by mouth 2 (two) times daily with a meal. 120 tablet 6  . TRUEPLUS LANCETS 28G MISC 1 each by Does not apply route 3 (three) times daily. 100 each 11  . Vitamin D, Ergocalciferol, (DRISDOL) 50000 units CAPS capsule Take 1 capsule (50,000 Units total) by mouth every 7 (seven) days. 12 capsule 0   No current facility-administered medications on file prior to visit.     No Known Allergies  Social History   Socioeconomic History  . Marital status: Single    Spouse name: Not on file  . Number of children: Not on file  . Years of education: Not on file  . Highest education level: Not on file  Occupational History  . Not on file  Social Needs  . Financial resource strain: Not on file  . Food insecurity:    Worry: Not on file    Inability: Not on file  . Transportation needs:    Medical: Not on file    Non-medical: Not on file  Tobacco Use  . Smoking status: Never Smoker  . Smokeless tobacco: Never Used  Substance and Sexual Activity  . Alcohol use: No  . Drug use: No  . Sexual activity: Yes    Partners: Male    Birth control/protection: I.U.D.  Lifestyle  . Physical activity:    Days per week: Not on file    Minutes per session: Not on file  . Stress: Not on file  Relationships  . Social connections:    Talks on phone: Not on file    Gets together: Not on file    Attends religious service: Not on file    Active member of club or organization: Not on file    Attends meetings of clubs or organizations: Not on file    Relationship status: Not on file  . Intimate  partner violence:  Fear of current or ex partner: Not on file    Emotionally abused: Not on file    Physically abused: Not on file    Forced sexual activity: Not on file  Other Topics Concern  . Not on file  Social History Narrative  . Not on file    No family history on file.  Past Surgical History:  Procedure Laterality Date  . CESAREAN SECTION      ROS: Review of Systems Negative except as stated above  PHYSICAL EXAM: BP (!) 145/76   Pulse 87   Temp 98.3 F (36.8 C) (Oral)   Resp 16   Wt 155 lb 12.8 oz (70.7 kg)   SpO2 98%   BMI 26.74 kg/m    Wt Readings from Last 3 Encounters:  03/23/18 155 lb 12.8 oz (70.7 kg)  12/20/17 153 lb 6.4 oz (69.6 kg)  09/29/17 150 lb 3.2 oz (68.1 kg)   Physical Exam  General appearance - alert, well appearing, and in no distress Mental status - normal mood, behavior, speech, dress, motor activity, and thought processes Neck - supple, no significant adenopathy Chest - clear to auscultation, no wheezes, rales or rhonchi, symmetric air entry Heart - normal rate, regular rhythm, normal S1, S2, no murmurs, rubs, clicks or gallops Extremities - peripheral pulses normal, trace lower extremity edema bilaterally Diabetic Foot Exam - Simple   Simple Foot Form Visual Inspection No deformities, no ulcerations, no other skin breakdown bilaterally:  Yes Sensation Testing Intact to touch and monofilament testing bilaterally:  Yes Pulse Check Posterior Tibialis and Dorsalis pulse intact bilaterally:  Yes Comments      CMP Latest Ref Rng & Units 12/20/2017 03/16/2017 08/12/2016  Glucose 65 - 99 mg/dL 217(H) 316(H) 164(H)  BUN 6 - 24 mg/dL 19 13 13   Creatinine 0.57 - 1.00 mg/dL 0.94 1.06(H) 1.05(H)  Sodium 134 - 144 mmol/L 135 134 136  Potassium 3.5 - 5.2 mmol/L 4.5 4.1 4.4  Chloride 96 - 106 mmol/L 100 98 95(L)  CO2 20 - 29 mmol/L 20 22 25   Calcium 8.7 - 10.2 mg/dL 9.7 9.7 9.7  Total Protein 6.0 - 8.5 g/dL 7.2 7.3 7.4  Total Bilirubin  0.0 - 1.2 mg/dL 0.2 0.2 0.3  Alkaline Phos 39 - 117 IU/L 86 79 63  AST 0 - 40 IU/L 41(H) 14 26  ALT 0 - 32 IU/L 57(H) 20 26   Lipid Panel     Component Value Date/Time   CHOL 241 (H) 09/29/2017 1639   TRIG 214 (H) 09/29/2017 1639   HDL 75 09/29/2017 1639   CHOLHDL 3.2 09/29/2017 1639   CHOLHDL 3.3 04/28/2015 1044   VLDL 36 (H) 04/28/2015 1044   LDLCALC 123 (H) 09/29/2017 1639    CBC    Component Value Date/Time   WBC 7.0 06/24/2017 1651   WBC 4.6 04/28/2015 1044   RBC 3.50 (L) 06/24/2017 1651   RBC 3.26 (L) 04/28/2015 1044   HGB 11.3 06/24/2017 1651   HCT 33.3 (L) 06/24/2017 1651   PLT 345 06/24/2017 1651   MCV 95 06/24/2017 1651   MCH 32.3 06/24/2017 1651   MCH 33.7 (H) 04/28/2015 1044   MCHC 33.9 06/24/2017 1651   MCHC 32.9 04/28/2015 1044   RDW 13.2 06/24/2017 1651   LYMPHSABS 2,116 04/28/2015 1044   MONOABS 322 04/28/2015 1044   EOSABS 184 04/28/2015 1044   BASOSABS 46 04/28/2015 1044    ASSESSMENT AND PLAN:  1. Uncontrolled type 2 diabetes mellitus with microalbuminuria,  with long-term current use of insulin (HCC) A1c has decreased by 3 points but still not at goal.  She will continue to work on eating habits.  She is agreeable to adding [place].  We will send prescription for the [facility] meter. She will check with her insurance carrier to see whether she can change her PCP to our health system.  If not she will establish with a primary physician with [facility] glucose (manual entry) - POCT glycosylated hemoglobin (Hb A1C) - Ambulatory referral to Ophthalmology - dapagliflozin propanediol (FARXIGA) 5 MG TABS tablet; Take 5 mg by mouth daily.  Dispense: 30 tablet; Refill: 4 - Microalbumin / creatinine urine ratio - Continuous Blood Gluc Receiver (FREESTYLE LIBRE READER) DEVI; 1 Device by Does not apply route once for 1 dose.  Dispense: 1 Device; Refill: 0 - Continuous Blood Gluc Sensor (FREESTYLE LIBRE SENSOR SYSTEM) MISC; Change sensor Q 2 wks   Dispense: 2 each; Refill: 12  2. Essential hypertension Not at goal.  Continue lisinopril.  Add low-dose HCTZ.  DASH diet discussed and encouraged - hydrochlorothiazide (HYDRODIURIL) 12.5 MG tablet; Take 1 tablet (12.5 mg total) by mouth daily.  Dispense: 90 tablet; Refill: 3  3. Postablative hypothyroidism We will check TSH today.  In the meantime she will continue taking 200 mcg daily of the levothyroxine.  Further management will be determined by results of TSH - TSH  4. Hyperlipemia, mixed - Hepatic Function Panel  5. Breast cancer screening - MM Digital Screening; Future   Patient was given the opportunity to ask questions.  Patient verbalized understanding of the plan and was able to repeat key elements of the plan.   Orders Placed This Encounter  Procedures  . MM Digital Screening  . TSH  . Hepatic Function Panel  . Microalbumin / creatinine urine ratio  . Ambulatory referral to Ophthalmology  . POCT glucose (manual entry)  . POCT glycosylated hemoglobin (Hb A1C)     Requested Prescriptions   Signed Prescriptions Disp Refills  . hydrochlorothiazide (HYDRODIURIL) 12.5 MG tablet 90 tablet 3    Sig: Take 1 tablet (12.5 mg total) by mouth daily.  . dapagliflozin propanediol (FARXIGA) 5 MG TABS tablet 30 tablet 4    Sig: Take 5 mg by mouth daily.  . Continuous Blood Gluc Receiver (FREESTYLE LIBRE READER) DEVI 1 Device 0    Sig: 1 Device by Does not apply route once for 1 dose.  . Continuous Blood Gluc Sensor (FREESTYLE LIBRE SENSOR SYSTEM) MISC 2 each 12    Sig: Change sensor Q 2 wks    Return in about 3 months (around 06/21/2018).  Karle Plumber, MD, FACP

## 2018-03-23 NOTE — Patient Instructions (Signed)
Your A1c has improved but not at goal.  Goal is to be less than 7.  We have added another diabetes medication called Farxiga 5 mg once a day.  Try to continue healthy eating habits.  Your blood pressure is not at goal of 130/80 or lower.  We have added a new blood pressure medication called hydrochlorothiazide 12.5 mg once a day.  Try to limit salt in the foods.  Try to check your blood pressure at least twice a week.

## 2018-03-24 ENCOUNTER — Other Ambulatory Visit: Payer: Self-pay | Admitting: Internal Medicine

## 2018-03-24 ENCOUNTER — Telehealth: Payer: Self-pay

## 2018-03-24 LAB — HEPATIC FUNCTION PANEL
ALT: 38 IU/L — AB (ref 0–32)
AST: 17 IU/L (ref 0–40)
Albumin: 4.5 g/dL (ref 3.8–4.8)
Alkaline Phosphatase: 114 IU/L (ref 39–117)
Bilirubin Total: 0.3 mg/dL (ref 0.0–1.2)
Bilirubin, Direct: 0.11 mg/dL (ref 0.00–0.40)
Total Protein: 7.6 g/dL (ref 6.0–8.5)

## 2018-03-24 LAB — MICROALBUMIN / CREATININE URINE RATIO
Creatinine, Urine: 89.9 mg/dL
Microalb/Creat Ratio: 119 mg/g creat — ABNORMAL HIGH (ref 0–29)
Microalbumin, Urine: 107.4 ug/mL

## 2018-03-24 LAB — TSH: TSH: 0.031 u[IU]/mL — ABNORMAL LOW (ref 0.450–4.500)

## 2018-03-24 MED ORDER — LEVOTHYROXINE SODIUM 100 MCG PO TABS: 100.0000 ug | ORAL_TABLET | Freq: Every day | ORAL | 3 refills | Status: AC

## 2018-03-24 NOTE — Telephone Encounter (Signed)
Contacted pt to go over lab results left a detailed vm informing pt of results and if she has any questions or concerns to give me a call  

## 2018-03-25 ENCOUNTER — Telehealth: Payer: Self-pay | Admitting: Internal Medicine

## 2018-03-25 NOTE — Telephone Encounter (Signed)
Phone call placed to patient this a.m.  I informed her that her thyroid level is still abnormal.  I recommend decreasing the levothyroxine to 100 mcg daily.  Recommend that she return to the lab in 6 weeks to have her thyroid level rechecked. I also informed her that she still has protein in the urine which can be an early indication of diabetes affecting the kidneys.  Lisinopril will help to protect the kidneys.  Patient expressed understanding.

## 2018-04-07 MED FILL — LISINOPRIL 10 MG TABS: 10 | 30 days supply | Qty: 30 | Fill #2

## 2018-04-07 MED FILL — LEVOTHYROXINE 100 MCG TAB: 100 | 30 days supply | Qty: 30 | Fill #0

## 2018-04-07 MED FILL — ATORVASTATIN 20 MG TABLET: 20 | 30 days supply | Qty: 30 | Fill #3

## 2018-04-07 MED FILL — LANTUS SOLOSTAR 100 UNITS/M: 100 | 24 days supply | Qty: 6 | Fill #3

## 2018-04-07 MED FILL — glipiZIDE 10 MG TABS: 10 | 30 days supply | Qty: 60 | Fill #3

## 2018-06-22 ENCOUNTER — Other Ambulatory Visit: Payer: Self-pay

## 2018-06-22 ENCOUNTER — Ambulatory Visit: Payer: Self-pay | Admitting: Internal Medicine

## 2018-06-23 ENCOUNTER — Ambulatory Visit: Payer: Self-pay | Attending: Internal Medicine | Admitting: Internal Medicine

## 2018-10-16 ENCOUNTER — Other Ambulatory Visit: Payer: Self-pay | Admitting: Cardiology

## 2018-10-16 DIAGNOSIS — Z20822 Contact with and (suspected) exposure to covid-19: Secondary | ICD-10-CM

## 2018-10-18 LAB — NOVEL CORONAVIRUS, NAA: SARS-CoV-2, NAA: NOT DETECTED

## 2018-10-30 ENCOUNTER — Other Ambulatory Visit: Payer: Self-pay | Admitting: Internal Medicine

## 2018-10-30 DIAGNOSIS — IMO0002 Reserved for concepts with insufficient information to code with codable children: Secondary | ICD-10-CM

## 2018-10-30 DIAGNOSIS — E1129 Type 2 diabetes mellitus with other diabetic kidney complication: Secondary | ICD-10-CM

## 2018-11-01 ENCOUNTER — Other Ambulatory Visit: Payer: Self-pay | Admitting: Internal Medicine

## 2018-11-01 DIAGNOSIS — IMO0002 Reserved for concepts with insufficient information to code with codable children: Secondary | ICD-10-CM

## 2018-11-01 DIAGNOSIS — E1129 Type 2 diabetes mellitus with other diabetic kidney complication: Secondary | ICD-10-CM

## 2018-11-27 ENCOUNTER — Other Ambulatory Visit: Payer: Self-pay | Admitting: Internal Medicine

## 2018-11-27 DIAGNOSIS — E1129 Type 2 diabetes mellitus with other diabetic kidney complication: Secondary | ICD-10-CM

## 2018-11-27 DIAGNOSIS — IMO0002 Reserved for concepts with insufficient information to code with codable children: Secondary | ICD-10-CM

## 2019-01-09 ENCOUNTER — Other Ambulatory Visit: Payer: Self-pay | Admitting: Internal Medicine

## 2019-01-09 DIAGNOSIS — IMO0002 Reserved for concepts with insufficient information to code with codable children: Secondary | ICD-10-CM

## 2019-01-09 DIAGNOSIS — E1129 Type 2 diabetes mellitus with other diabetic kidney complication: Secondary | ICD-10-CM

## 2019-01-24 ENCOUNTER — Other Ambulatory Visit: Payer: Self-pay | Admitting: Internal Medicine

## 2019-01-24 DIAGNOSIS — IMO0002 Reserved for concepts with insufficient information to code with codable children: Secondary | ICD-10-CM

## 2019-01-24 DIAGNOSIS — E1129 Type 2 diabetes mellitus with other diabetic kidney complication: Secondary | ICD-10-CM

## 2019-03-24 ENCOUNTER — Other Ambulatory Visit: Payer: Self-pay | Admitting: Internal Medicine

## 2019-03-24 DIAGNOSIS — I10 Essential (primary) hypertension: Secondary | ICD-10-CM

## 2019-03-30 ENCOUNTER — Ambulatory Visit: Payer: Medicaid Other | Attending: Internal Medicine

## 2019-03-30 DIAGNOSIS — Z23 Encounter for immunization: Secondary | ICD-10-CM | POA: Insufficient documentation

## 2019-03-30 NOTE — Progress Notes (Signed)
   Covid-19 Vaccination Clinic  Name:  Ariana Cunningham    MRN: [id number] DOB: 09/03/1970  03/30/2019  Ms. Esters was observed post Covid-19 immunization for 15 minutes without incident. She was provided with Vaccine Information Sheet and instruction to access the V-Safe system.   [name] was instructed to call 911 with any severe reactions post vaccine: Marland Kitchen Difficulty breathing  . Swelling of face and throat  . A fast heartbeat  . A bad rash all over body  . Dizziness and weakness   Immunizations Administered    Name Date Dose VIS Date Route   Pfizer COVID-19 Vaccine 03/30/2019 11:26 AM 0.3 mL 01/05/2019 Intramuscular   Manufacturer: [employer], [employer]   Lot: [id number]   NDC: [address]-1

## 2019-04-25 ENCOUNTER — Ambulatory Visit: Payer: Medicaid Other | Attending: Internal Medicine

## 2019-04-25 DIAGNOSIS — Z23 Encounter for immunization: Secondary | ICD-10-CM

## 2019-04-25 NOTE — Progress Notes (Signed)
   Covid-19 Vaccination Clinic  Name:  AJAHNAE RATHGEBER    MRN: [id number] DOB: [birth year]  04/25/2019  Ms. Frappier was observed post Covid-19 immunization for 15 minutes without incident. She was provided with Vaccine Information Sheet and instruction to access the V-Safe system.   Ms. Sakuma was instructed to call 911 with any severe reactions post vaccine: Marland Kitchen Difficulty breathing  . Swelling of face and throat  . A fast heartbeat  . A bad rash all over body  . Dizziness and weakness   Immunizations Administered    Name Date Dose VIS Date Route   Pfizer COVID-19 Vaccine 04/25/2019 11:35 AM 0.3 mL 01/05/2019 Intramuscular   Manufacturer: [employer], [employer]   Lot: [id number]   NDC: [address]-1

## 2019-05-18 ENCOUNTER — Other Ambulatory Visit: Payer: Self-pay | Admitting: Internal Medicine

## 2019-05-18 DIAGNOSIS — I10 Essential (primary) hypertension: Secondary | ICD-10-CM

## 2019-06-15 ENCOUNTER — Other Ambulatory Visit: Payer: Self-pay | Admitting: Internal Medicine

## 2019-06-15 DIAGNOSIS — I10 Essential (primary) hypertension: Secondary | ICD-10-CM

## 2019-06-17 ENCOUNTER — Other Ambulatory Visit: Payer: Self-pay | Admitting: Internal Medicine

## 2019-06-17 DIAGNOSIS — I10 Essential (primary) hypertension: Secondary | ICD-10-CM

## 2019-07-26 ENCOUNTER — Other Ambulatory Visit: Payer: Self-pay | Admitting: Internal Medicine

## 2019-07-26 DIAGNOSIS — I10 Essential (primary) hypertension: Secondary | ICD-10-CM

## 2019-07-26 NOTE — Telephone Encounter (Signed)
Requested medication (s) are due for refill today: yes  Requested medication (s) are on the active medication list:yes  Last refill: 03/23/18   #90  3 refills  Future visit scheduled: No  Notes to clinic:  Called left VM to return call for appointment. Looks like patient has changed offices and is no longer under Dr [employer] care but just wanted to make sure!    Requested Prescriptions  Pending Prescriptions Disp Refills   hydrochlorothiazide (HYDRODIURIL) 12.5 MG tablet [Pharmacy Med Name: HYDROCHLOROTHIAZIDE 12.5 MG TB] 90 tablet 3    Sig: TAKE 1 TABLET BY MOUTH EVERY DAY      Cardiovascular: Diuretics - Thiazide Failed - 07/26/2019  9:23 AM      Failed - Ca in normal range and within 360 days    Calcium  Date Value Ref Range Status  12/20/2017 9.7 8.7 - 10.2 mg/dL Final          Failed - Cr in normal range and within 360 days    Creat  Date Value Ref Range Status  12/02/2015 1.07 0.50 - 1.10 mg/dL Final   Creatinine, Ser  Date Value Ref Range Status  12/20/2017 0.94 0.57 - 1.00 mg/dL Final   Creatinine, POC  Date Value Ref Range Status  08/12/2016 50 mg/dL Final   Creatinine, Urine  Date Value Ref Range Status  12/02/2015 83 20 - 320 mg/dL Final          Failed - K in normal range and within 360 days    Potassium  Date Value Ref Range Status  12/20/2017 4.5 3.5 - 5.2 mmol/L Final          Failed - Na in normal range and within 360 days    Sodium  Date Value Ref Range Status  12/20/2017 135 134 - 144 mmol/L Final          Failed - Last BP in normal range    BP Readings from Last 1 Encounters:  03/23/18 (!) 145/76          Failed - Valid encounter within last 6 months    Recent Outpatient Visits           1 year ago Uncontrolled type 2 diabetes mellitus with microalbuminuria, with long-term current use of insulin (HCC)   [facility] [place], Gavin Pound B, MD   1 year ago Controlled type 2 diabetes mellitus without  complication, with long-term current use of insulin (HCC)   [facility] Marcine Matar, MD   1 year ago Diabetes mellitus type 2, uncontrolled, without complications [facility])   [facility] Marcine Matar, MD   1 year ago DM (diabetes mellitus) type 2, uncontrolled, with ketoacidosis [facility])   [facility] [place], [place], [place]   2 years ago Acute vaginitis   [facility] Marcine Matar, MD

## 2019-09-13 ENCOUNTER — Ambulatory Visit: Payer: Medicaid Other | Admitting: Internal Medicine

## 2020-12-23 ENCOUNTER — Telehealth: Payer: Self-pay | Admitting: Internal Medicine

## 2020-12-23 NOTE — Telephone Encounter (Signed)
Copied from CRM [id number]. Topic: General - Other >> Dec 15, 2020  1:24 PM Ariana Cunningham, Rosey Bath D wrote: Reason for CRM: Patient called and would like to speak with someone in finance about getting help with a bill she received. Please call patient back, thanks.

## 2020-12-25 NOTE — Telephone Encounter (Signed)
I return Pt call, I inform the Pt that she has Medicaid and if she has any bill she need to call the billing dept. From those bill and inform them what she has

## 2020-12-31 ENCOUNTER — Other Ambulatory Visit: Payer: Self-pay
